# Patient Record
Sex: Female | Born: 1955 | Race: White | Hispanic: No | Marital: Married | Smoking: Never smoker
Health system: Southern US, Community
[De-identification: ages and names within clinical notes are randomized; demographics above are authoritative.]

## PROBLEM LIST (undated history)

## (undated) DIAGNOSIS — K760 Fatty (change of) liver, not elsewhere classified: Secondary | ICD-10-CM

## (undated) DIAGNOSIS — E039 Hypothyroidism, unspecified: Secondary | ICD-10-CM

## (undated) DIAGNOSIS — K219 Gastro-esophageal reflux disease without esophagitis: Secondary | ICD-10-CM

## (undated) DIAGNOSIS — K2100 Gastro-esophageal reflux disease with esophagitis, without bleeding: Secondary | ICD-10-CM

## (undated) DIAGNOSIS — I10 Essential (primary) hypertension: Secondary | ICD-10-CM

## (undated) DIAGNOSIS — M546 Pain in thoracic spine: Secondary | ICD-10-CM

## (undated) DIAGNOSIS — K824 Cholesterolosis of gallbladder: Secondary | ICD-10-CM

## (undated) DIAGNOSIS — M8580 Other specified disorders of bone density and structure, unspecified site: Secondary | ICD-10-CM

## (undated) DIAGNOSIS — K635 Polyp of colon: Secondary | ICD-10-CM

## (undated) HISTORY — DX: Other specified disorders of bone density and structure, unspecified site: M85.80

## (undated) HISTORY — DX: Hypothyroidism, unspecified: E03.9

## (undated) HISTORY — DX: Gastro-esophageal reflux disease with esophagitis, without bleeding: K21.00

## (undated) HISTORY — DX: Essential (primary) hypertension: I10

## (undated) HISTORY — DX: Gastro-esophageal reflux disease with esophagitis: K21.0

## (undated) HISTORY — PX: COLONOSCOPY: SHX174

## (undated) HISTORY — DX: Cholesterolosis of gallbladder: K82.4

## (undated) HISTORY — DX: Fatty (change of) liver, not elsewhere classified: K76.0

## (undated) HISTORY — PX: PARTIAL HYSTERECTOMY: SHX80

## (undated) HISTORY — DX: Polyp of colon: K63.5

## (undated) HISTORY — DX: Pain in thoracic spine: M54.6

## (undated) HISTORY — DX: Gastro-esophageal reflux disease without esophagitis: K21.9

---

## 1998-07-09 ENCOUNTER — Ambulatory Visit (HOSPITAL_COMMUNITY): Admission: RE | Admit: 1998-07-09 | Discharge: 1998-07-09 | Payer: Self-pay | Admitting: Obstetrics and Gynecology

## 1999-01-14 ENCOUNTER — Encounter (INDEPENDENT_AMBULATORY_CARE_PROVIDER_SITE_OTHER): Payer: Self-pay | Admitting: Specialist

## 1999-01-14 ENCOUNTER — Inpatient Hospital Stay (HOSPITAL_COMMUNITY): Admission: RE | Admit: 1999-01-14 | Discharge: 1999-01-15 | Payer: Self-pay | Admitting: Obstetrics and Gynecology

## 1999-08-04 ENCOUNTER — Encounter: Payer: Self-pay | Admitting: Obstetrics and Gynecology

## 1999-08-04 ENCOUNTER — Encounter: Admission: RE | Admit: 1999-08-04 | Discharge: 1999-08-04 | Payer: Self-pay | Admitting: Obstetrics and Gynecology

## 2000-02-19 ENCOUNTER — Other Ambulatory Visit: Admission: RE | Admit: 2000-02-19 | Discharge: 2000-02-19 | Payer: Self-pay | Admitting: Obstetrics and Gynecology

## 2000-04-02 ENCOUNTER — Ambulatory Visit (HOSPITAL_COMMUNITY): Admission: RE | Admit: 2000-04-02 | Discharge: 2000-04-02 | Payer: Self-pay | Admitting: Family Medicine

## 2000-04-02 ENCOUNTER — Encounter: Payer: Self-pay | Admitting: Family Medicine

## 2000-05-04 ENCOUNTER — Ambulatory Visit (HOSPITAL_COMMUNITY): Admission: RE | Admit: 2000-05-04 | Discharge: 2000-05-04 | Payer: Self-pay | Admitting: Internal Medicine

## 2000-05-04 ENCOUNTER — Encounter (INDEPENDENT_AMBULATORY_CARE_PROVIDER_SITE_OTHER): Payer: Self-pay

## 2000-05-04 HISTORY — PX: ESOPHAGOGASTRODUODENOSCOPY: SHX1529

## 2000-10-05 ENCOUNTER — Encounter: Admission: RE | Admit: 2000-10-05 | Discharge: 2000-10-05 | Payer: Self-pay | Admitting: Obstetrics and Gynecology

## 2000-10-05 ENCOUNTER — Encounter: Payer: Self-pay | Admitting: Obstetrics and Gynecology

## 2001-06-09 ENCOUNTER — Ambulatory Visit (HOSPITAL_COMMUNITY): Admission: RE | Admit: 2001-06-09 | Discharge: 2001-06-09 | Payer: Self-pay | Admitting: Internal Medicine

## 2001-06-09 ENCOUNTER — Encounter (INDEPENDENT_AMBULATORY_CARE_PROVIDER_SITE_OTHER): Payer: Self-pay | Admitting: Specialist

## 2001-06-09 DIAGNOSIS — K635 Polyp of colon: Secondary | ICD-10-CM

## 2001-06-09 HISTORY — DX: Polyp of colon: K63.5

## 2002-01-28 ENCOUNTER — Ambulatory Visit (HOSPITAL_COMMUNITY): Admission: RE | Admit: 2002-01-28 | Discharge: 2002-01-28 | Payer: Self-pay | Admitting: *Deleted

## 2002-01-28 ENCOUNTER — Encounter: Payer: Self-pay | Admitting: *Deleted

## 2002-02-08 ENCOUNTER — Encounter: Admission: RE | Admit: 2002-02-08 | Discharge: 2002-02-08 | Payer: Self-pay | Admitting: *Deleted

## 2002-02-08 ENCOUNTER — Encounter: Payer: Self-pay | Admitting: *Deleted

## 2002-09-07 ENCOUNTER — Other Ambulatory Visit: Admission: RE | Admit: 2002-09-07 | Discharge: 2002-09-07 | Payer: Self-pay | Admitting: Obstetrics and Gynecology

## 2003-09-18 ENCOUNTER — Encounter: Admission: RE | Admit: 2003-09-18 | Discharge: 2003-09-18 | Payer: Self-pay | Admitting: Obstetrics and Gynecology

## 2003-10-17 ENCOUNTER — Other Ambulatory Visit: Admission: RE | Admit: 2003-10-17 | Discharge: 2003-10-17 | Payer: Self-pay | Admitting: Obstetrics and Gynecology

## 2004-12-18 ENCOUNTER — Other Ambulatory Visit: Admission: RE | Admit: 2004-12-18 | Discharge: 2004-12-18 | Payer: Self-pay | Admitting: Obstetrics and Gynecology

## 2005-08-18 ENCOUNTER — Encounter: Admission: RE | Admit: 2005-08-18 | Discharge: 2005-08-18 | Payer: Self-pay | Admitting: Obstetrics and Gynecology

## 2007-08-01 ENCOUNTER — Encounter: Admission: RE | Admit: 2007-08-01 | Discharge: 2007-08-01 | Payer: Self-pay | Admitting: Obstetrics and Gynecology

## 2010-04-24 ENCOUNTER — Emergency Department (HOSPITAL_BASED_OUTPATIENT_CLINIC_OR_DEPARTMENT_OTHER)
Admission: EM | Admit: 2010-04-24 | Discharge: 2010-04-24 | Payer: Self-pay | Source: Home / Self Care | Admitting: Emergency Medicine

## 2010-04-24 IMAGING — US US ABDOMEN COMPLETE
1 series · 14 of 25 positions shown · non-contrast
Comparison: None.

CLINICAL DATA: Abdominal pain.  Right flank pain

COMPLETE ABDOMINAL ULTRASOUND

[Series 1: us abdomen complete · 0.30mm/px · 14 of 75 slices shown]
[im 1/75]
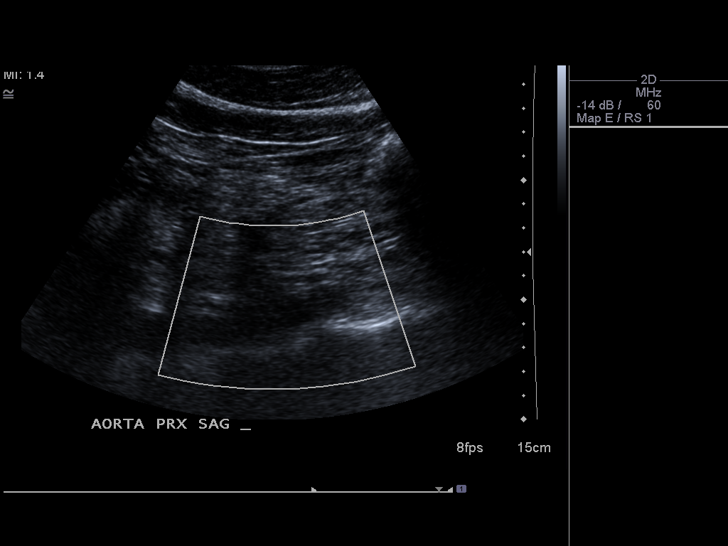
[im 7/75]
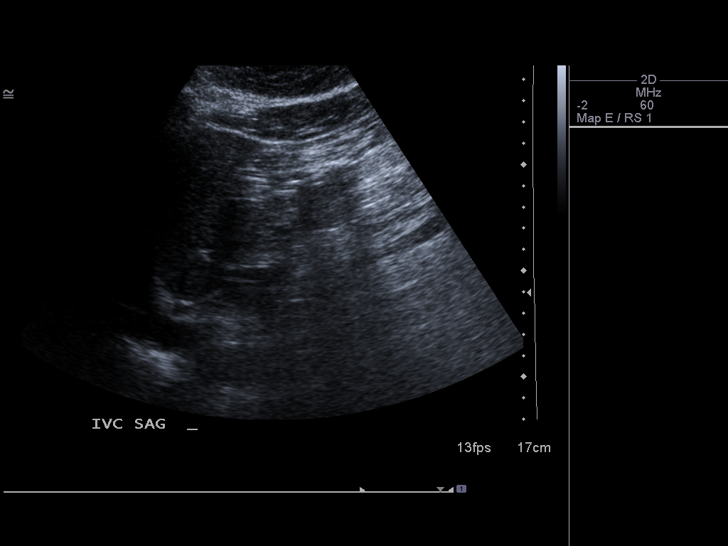
[im 13/75]
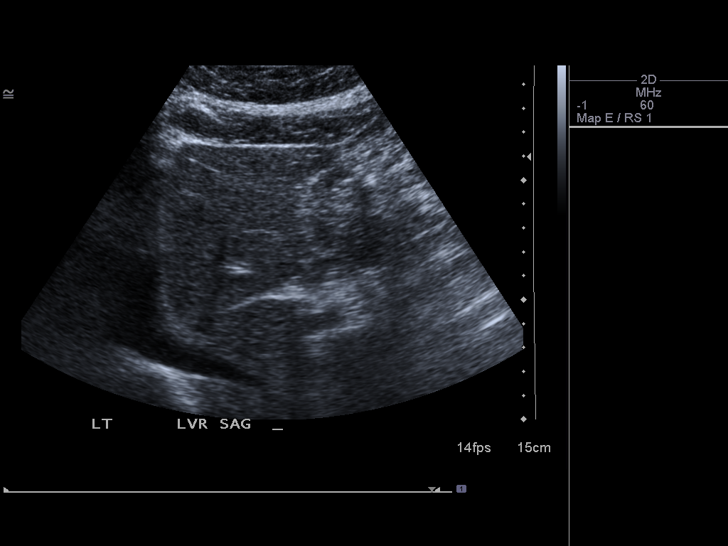
[im 19/75]
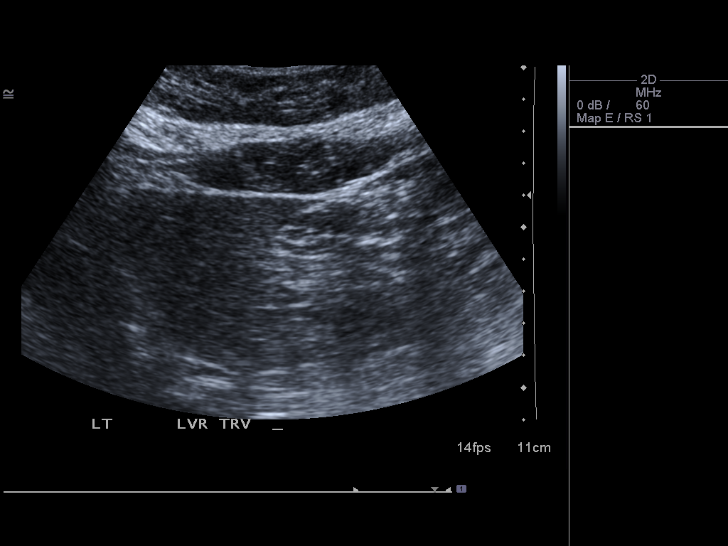
[im 25/75]
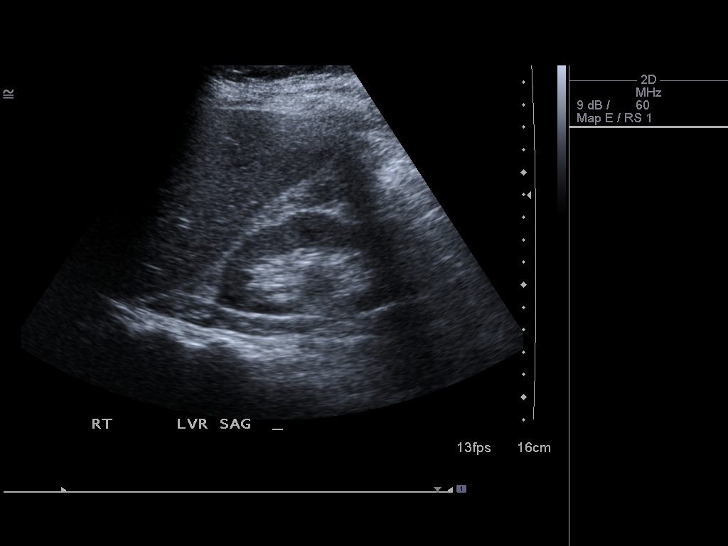
[im 28/75]
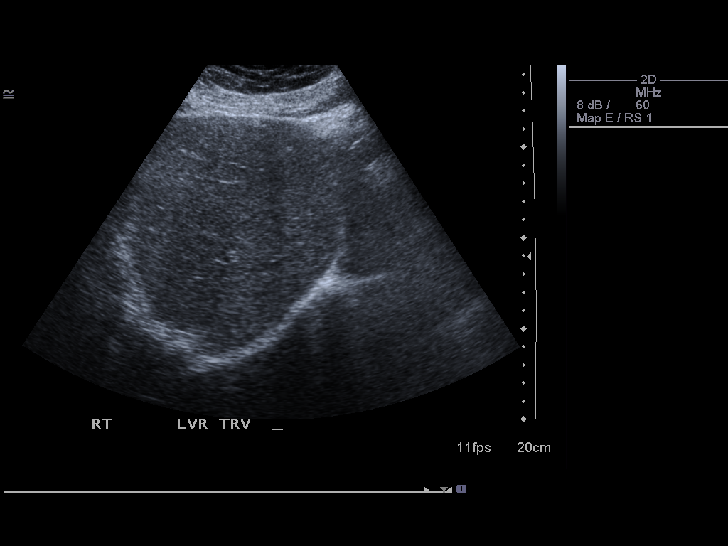
[im 34/75]
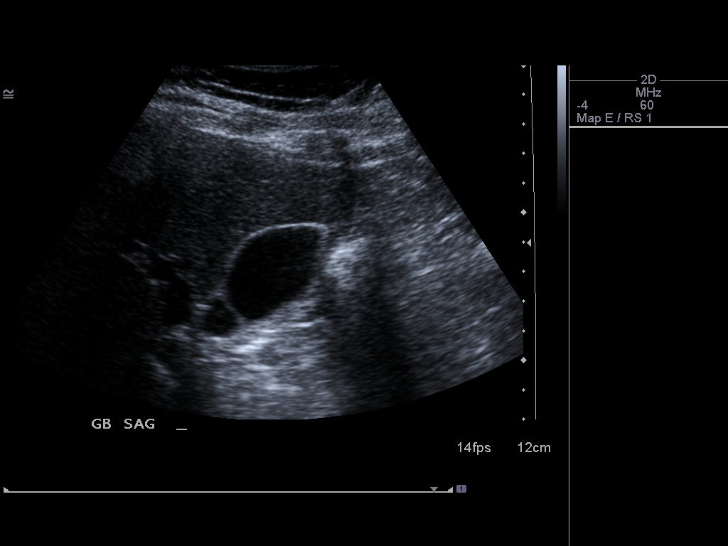
[im 41/75]
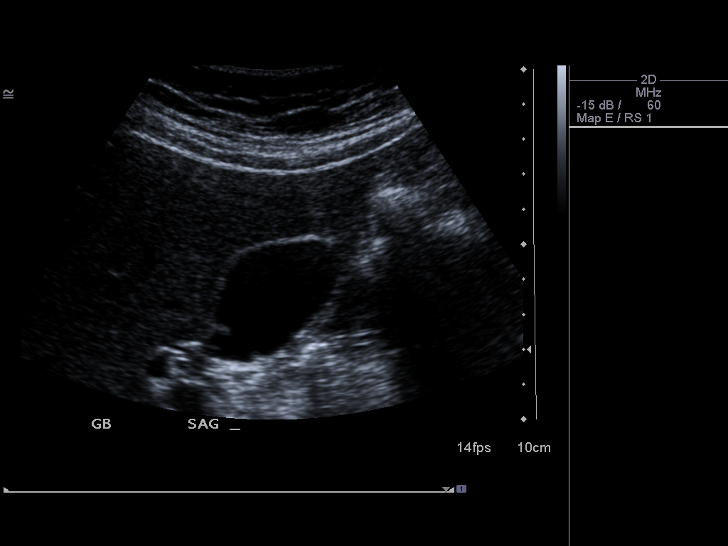
[im 47/75]
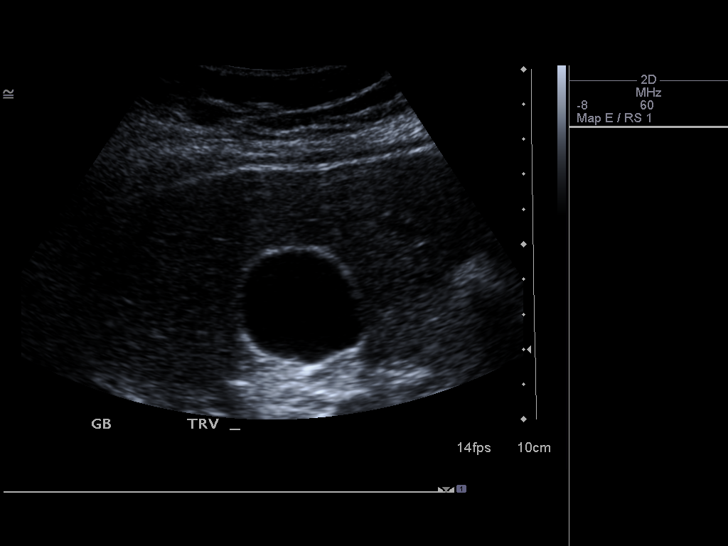
[im 50/75]
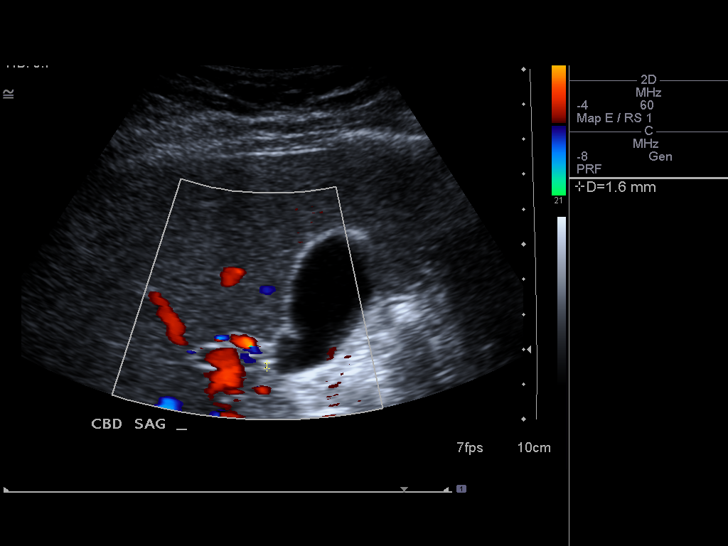
[im 56/75]
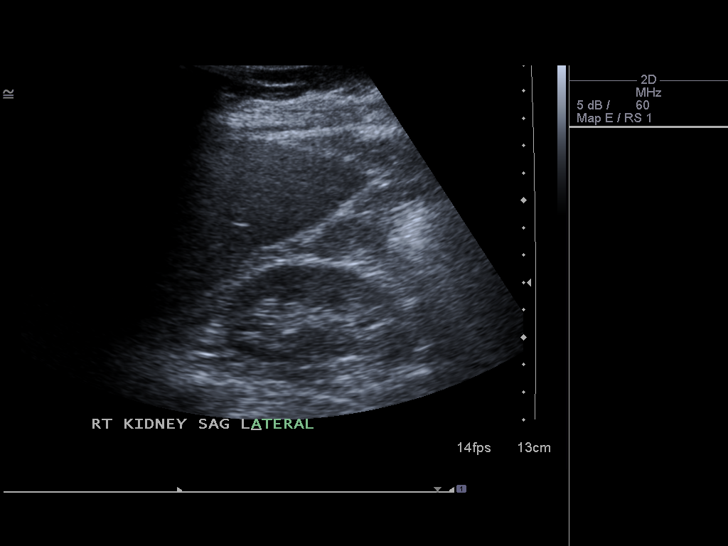
[im 62/75]
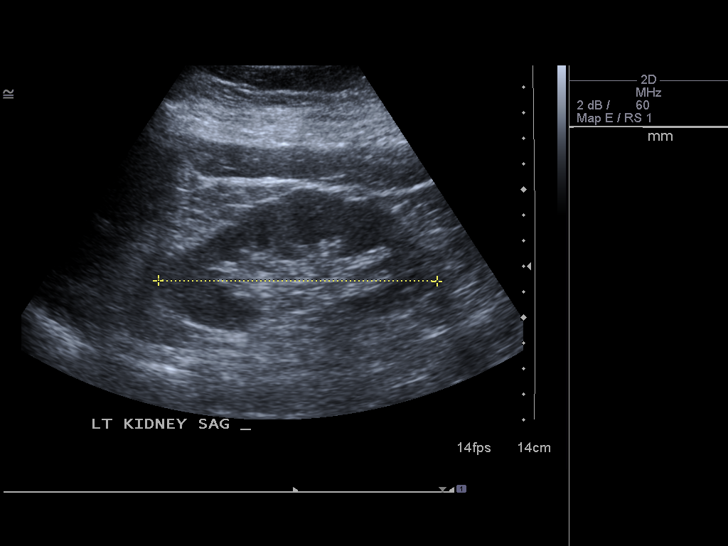
[im 68/75]
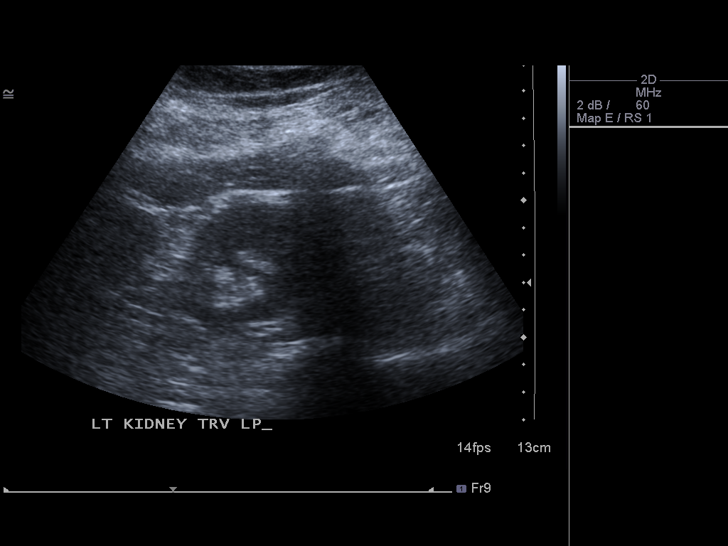
[im 75/75]
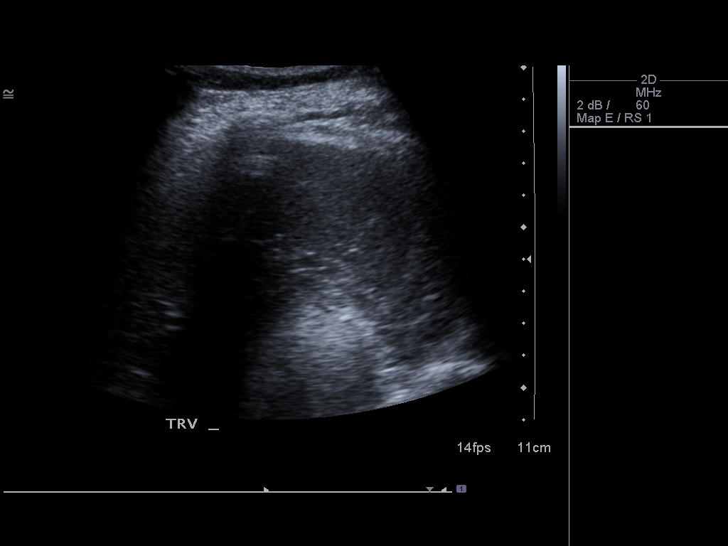

[14 of 25 positions shown; findings below may reference images not displayed]

FINDINGS: Gallbladder:  No gallstones, gallbladder wall thickening, or
pericholecystic fluid.

Common bile duct:  1.6 mm

Liver:  Normal echogenicity.  Negative for mass lesion in the
liver.

IVC:   limited

Pancreas:  Limited

Spleen:  8.6 cm

Right Kidney:  10.1 cm.  Negative for obstruction or mass.

Left Kidney:  10.9 cm.  Negative for obstruction or mass.

Abdominal aorta:  No aneurysm identified.
IMPRESSION: Negative abdominal ultrasound.

## 2010-04-24 IMAGING — CR DG CHEST 2V
2 series · 2 of 2 positions shown · non-contrast
Comparison: None.

CLINICAL DATA: Right rib pain.  Back pain

CHEST - 2 VIEW

[w chest pa]
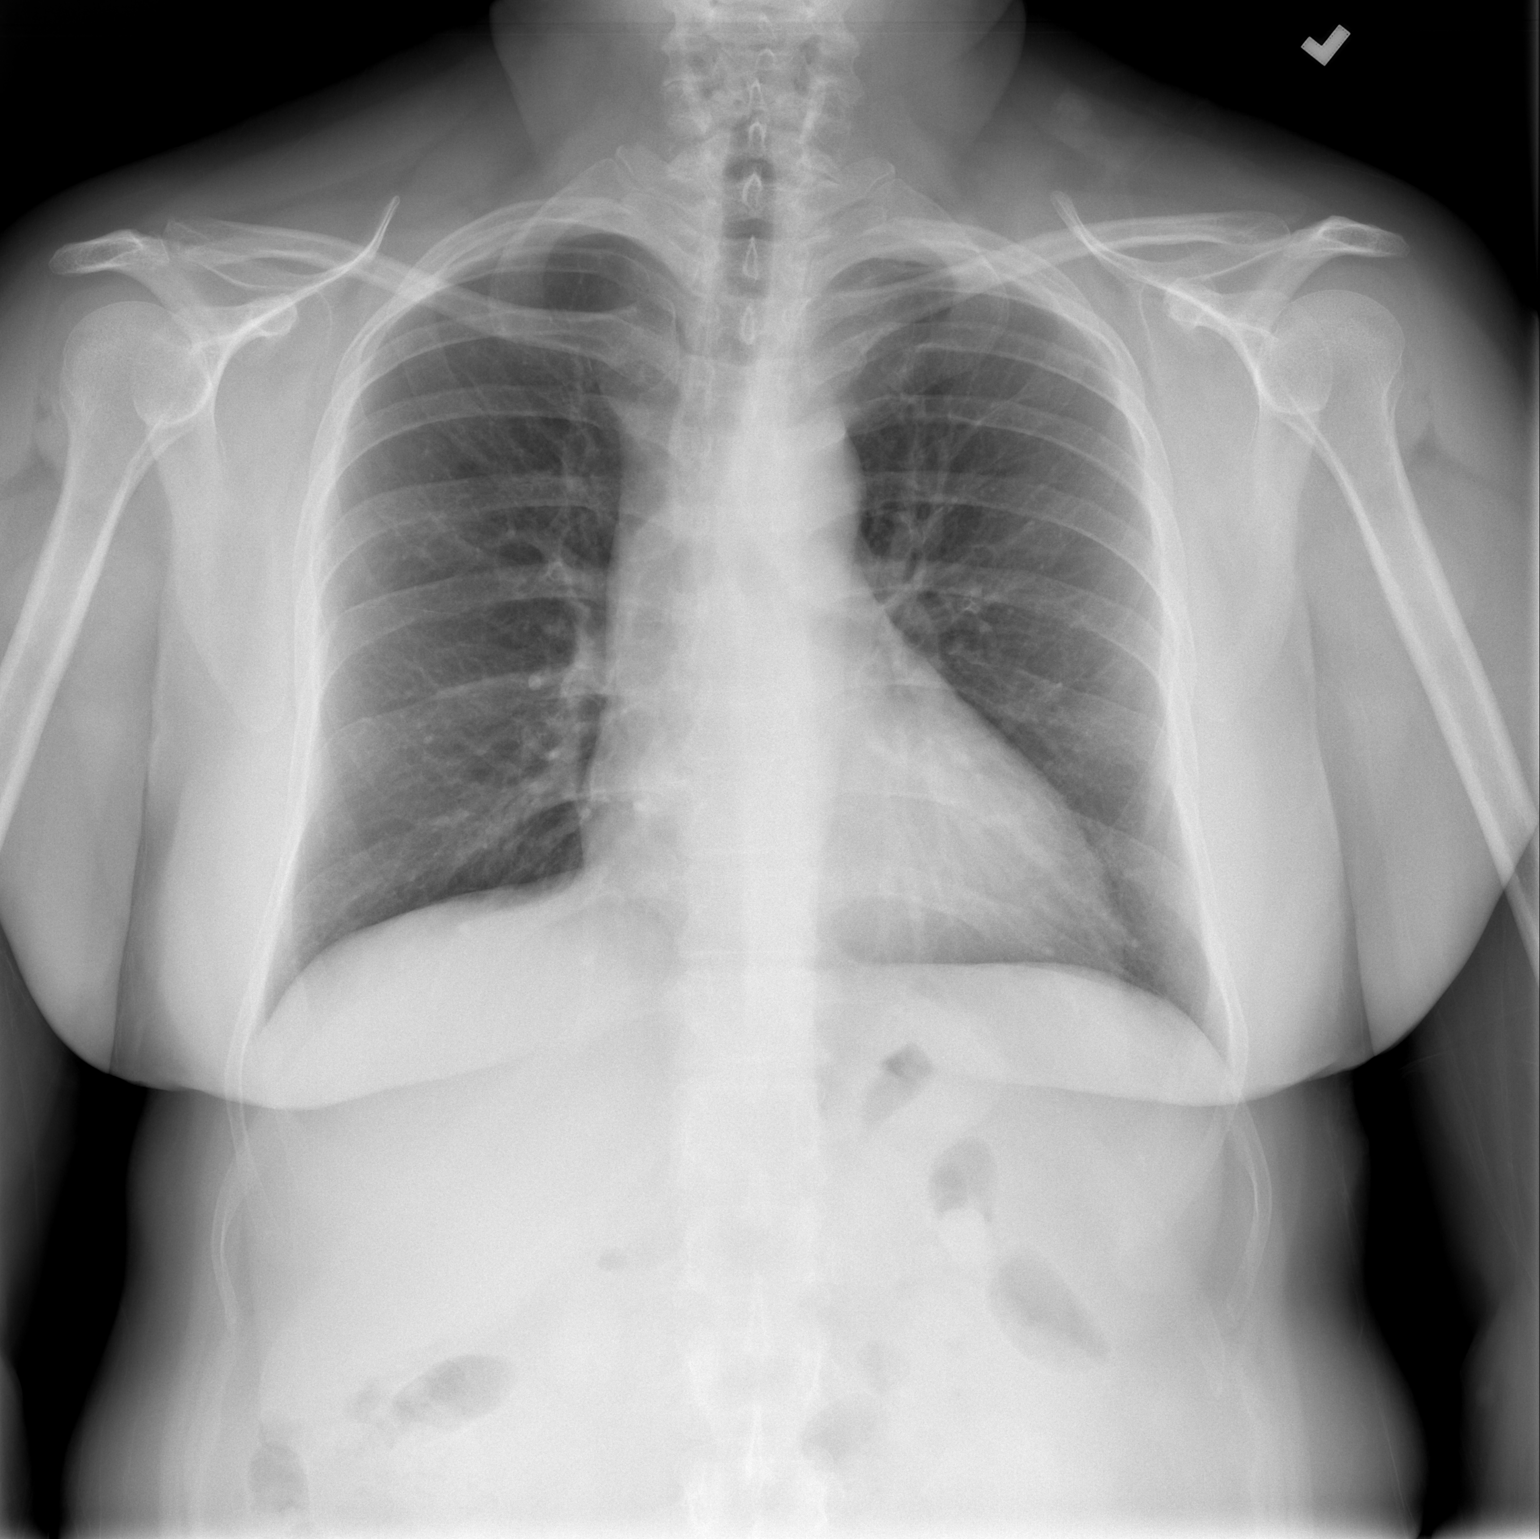

[w chest lat]
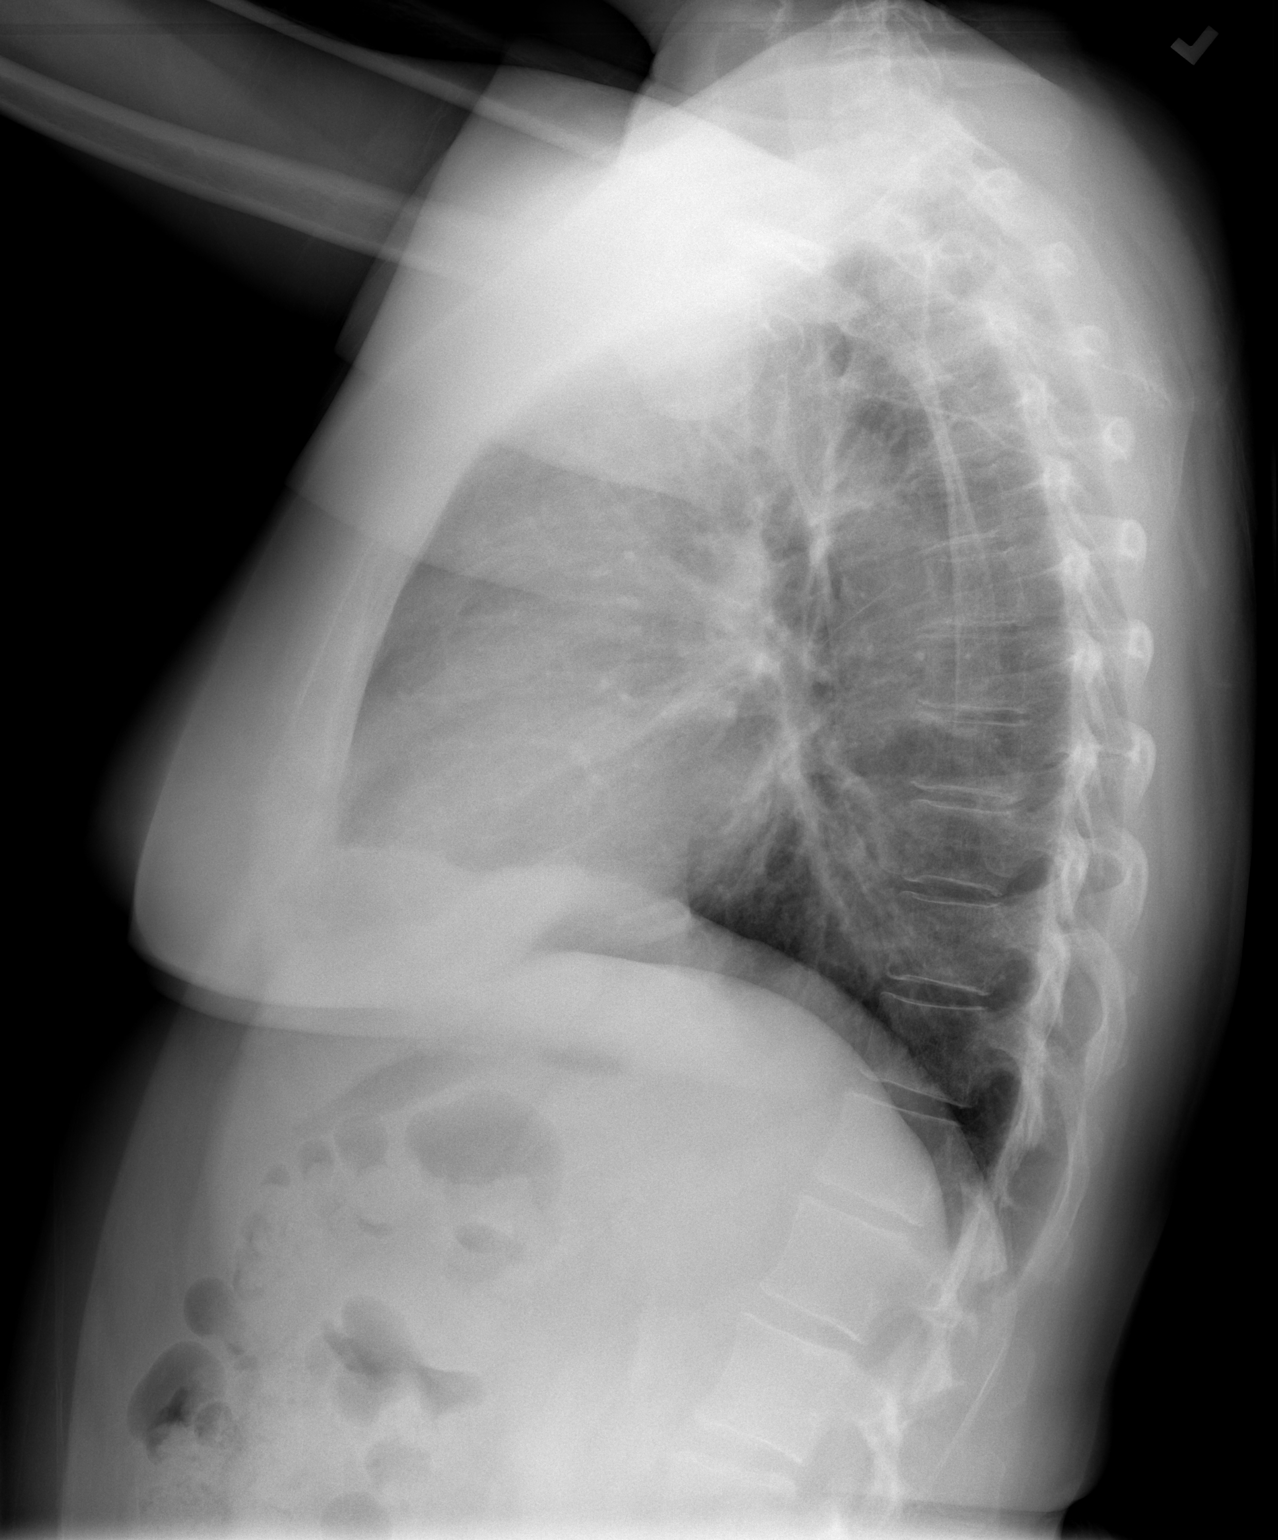

[2 of 2 positions shown; findings below may reference images not displayed]

FINDINGS: The heart size and mediastinal contours are within
normal limits.  Both lungs are clear.  The visualized skeletal
structures are unremarkable.
IMPRESSION: No active cardiopulmonary disease.

## 2010-07-07 LAB — URINALYSIS, ROUTINE W REFLEX MICROSCOPIC
Bilirubin Urine: NEGATIVE
Glucose, UA: NEGATIVE mg/dL
Ketones, ur: NEGATIVE mg/dL
Nitrite: NEGATIVE
Specific Gravity, Urine: 1.003 — ABNORMAL LOW (ref 1.005–1.030)
pH: 6.5 (ref 5.0–8.0)

## 2010-07-07 LAB — DIFFERENTIAL
Basophils Absolute: 0 10*3/uL (ref 0.0–0.1)
Basophils Relative: 1 % (ref 0–1)
Lymphocytes Relative: 22 % (ref 12–46)
Lymphs Abs: 1.6 10*3/uL (ref 0.7–4.0)
Monocytes Relative: 11 % (ref 3–12)
Neutro Abs: 5 10*3/uL (ref 1.7–7.7)

## 2010-07-07 LAB — COMPREHENSIVE METABOLIC PANEL
AST: 18 U/L (ref 0–37)
Albumin: 4.3 g/dL (ref 3.5–5.2)
CO2: 29 mEq/L (ref 19–32)
Potassium: 4.6 mEq/L (ref 3.5–5.1)
Sodium: 145 mEq/L (ref 135–145)

## 2010-07-07 LAB — CBC
Hemoglobin: 13.5 g/dL (ref 12.0–15.0)
Platelets: 286 10*3/uL (ref 150–400)

## 2010-09-12 NOTE — Procedures (Signed)
[place]. [facility]  Patient:    Anne Simpson, Anne Simpson Visit Number: [id number] MRN: [id number]          Service Type: END Location: ENDO Attending Physician:  Mervin Hack Dictated by:   Hedwig Morton. Juanda Chance, M.D. [facility] Admit Date:  06/09/2001   CC:         Amada Jupiter T. Dreiling, M.D.   Procedure Report  PROCEDURE:  Colonoscopy.  INDICATION:  This 55 year old white female has a positive family history of colon cancer in her brother, who died at age of 81.  Her mother has a history of colon polyps.  The patient has had several colonoscopies elsewhere in 1993, 1995, and 1996.  She is now undergoing repeat colonoscopy.  ENDOSCOPE:  Olympus single-channel video scope.  SEDATION:  Versed 8.5 mg IV, Demerol 100 mg IV.  FINDINGS:  The Olympus single-channel video endoscope passed under direct vision through the rectum to the sigmoid colon.  The patient was monitored by pulse oximeter, and oxygen saturations were normal.  The prep was excellent. Anal canal and rectal ampulla were normal.  At the level of 15 cm from the rectum were two polyps.  One measured about 9 mm in diameter and was sessile. One was tiny, diminutive polyp next to it.  There were no diverticula in the sigmoid colon.  Splenic flexure, transverse colon, hepatic flexure were normal.  Ascending colon and cecum were normal.  As the colonoscope was retracted from the cecum to the left colon, the two polyps were removed with snare and sent to pathology.  The patient tolerated the procedure well.  IMPRESSION:  Two colon polyps, status post polypectomy.  PLAN: 1. Postpolypectomy instructions. 2. Repeat colonoscopy in three years. Dictated by:   Hedwig Morton. Juanda Chance, M.D. [facility] Attending Physician:  Mervin Hack DD:  06/09/01 TD:  06/09/01 Job: [id number]

## 2013-09-11 ENCOUNTER — Encounter: Payer: Self-pay | Admitting: Internal Medicine

## 2013-11-02 ENCOUNTER — Other Ambulatory Visit: Payer: Self-pay | Admitting: Internal Medicine

## 2013-11-02 ENCOUNTER — Ambulatory Visit
Admission: RE | Admit: 2013-11-02 | Discharge: 2013-11-02 | Disposition: A | Discharge status: Home / Self Care | Payer: BC Managed Care – PPO | Source: Ambulatory Visit | Attending: Internal Medicine | Admitting: Internal Medicine

## 2013-11-02 DIAGNOSIS — M546 Pain in thoracic spine: Secondary | ICD-10-CM

## 2013-11-02 DIAGNOSIS — R110 Nausea: Secondary | ICD-10-CM

## 2013-11-02 DIAGNOSIS — K760 Fatty (change of) liver, not elsewhere classified: Secondary | ICD-10-CM

## 2013-11-02 DIAGNOSIS — R1031 Right lower quadrant pain: Secondary | ICD-10-CM

## 2013-11-02 DIAGNOSIS — K824 Cholesterolosis of gallbladder: Secondary | ICD-10-CM

## 2013-11-02 HISTORY — DX: Fatty (change of) liver, not elsewhere classified: K76.0

## 2013-11-02 HISTORY — DX: Cholesterolosis of gallbladder: K82.4

## 2013-11-02 IMAGING — US US ABDOMEN COMPLETE
1 series · 14 of 25 positions shown · non-contrast
Comparison: None.

CLINICAL DATA: Pain.

EXAM:
ULTRASOUND ABDOMEN COMPLETE

[Series 1: us abdomen complete · 0.30mm/px · 14 of 75 slices shown]
[im 1/75]
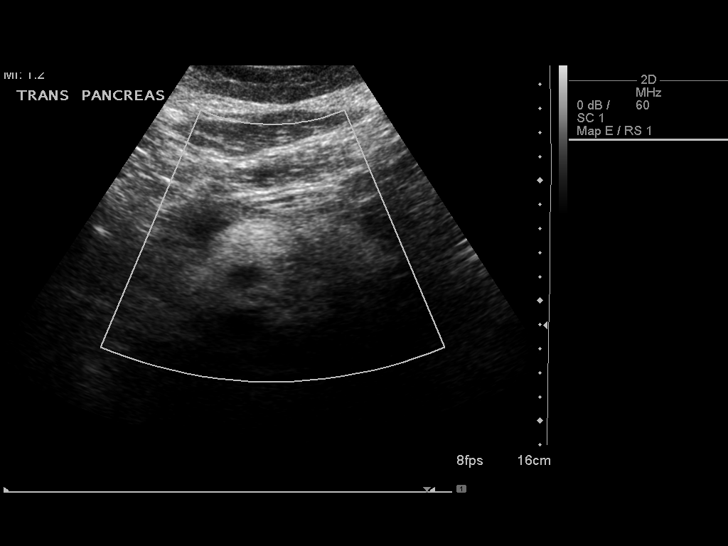
[im 7/75]
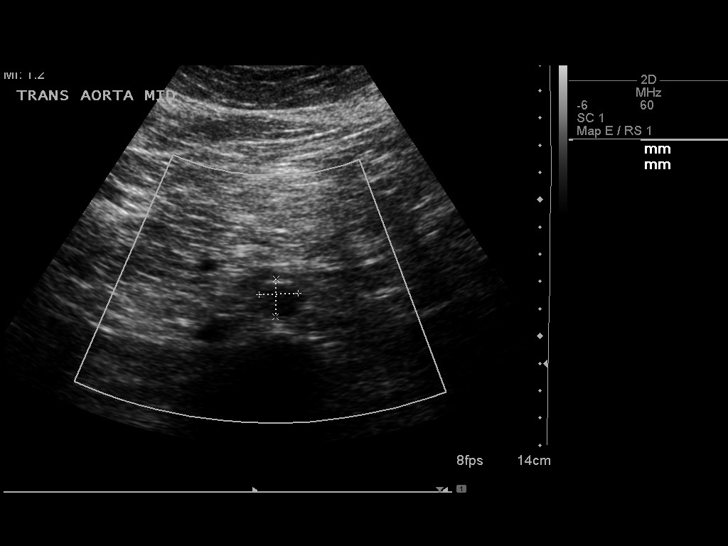
[im 13/75]
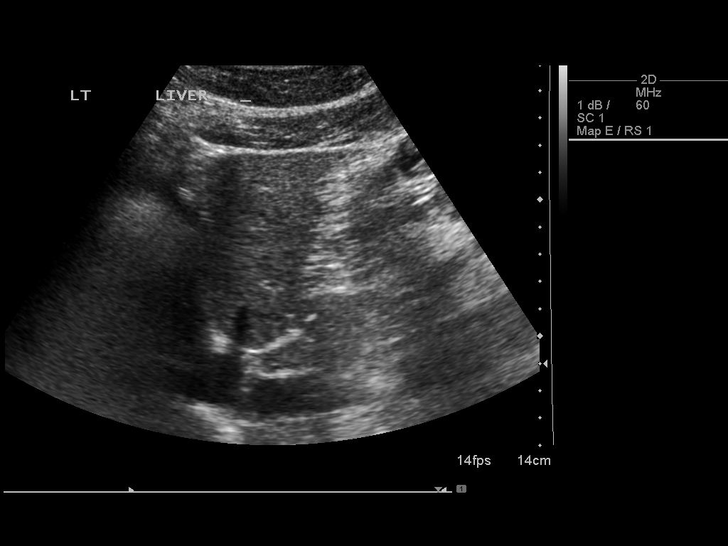
[im 19/75]
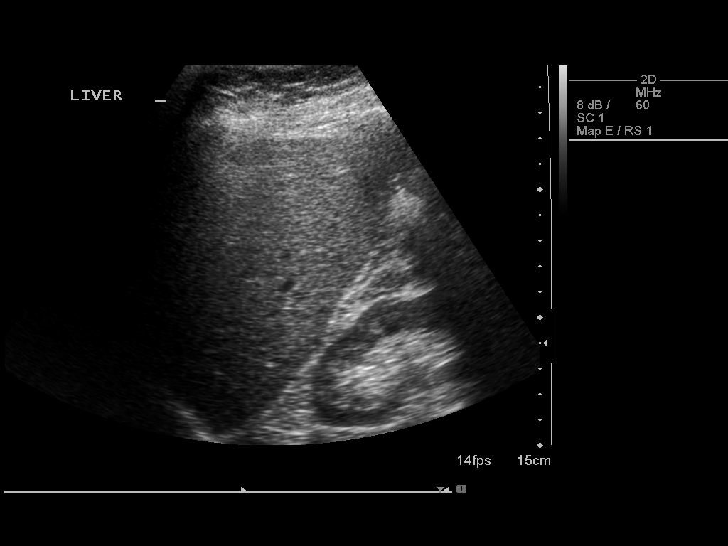
[im 25/75]
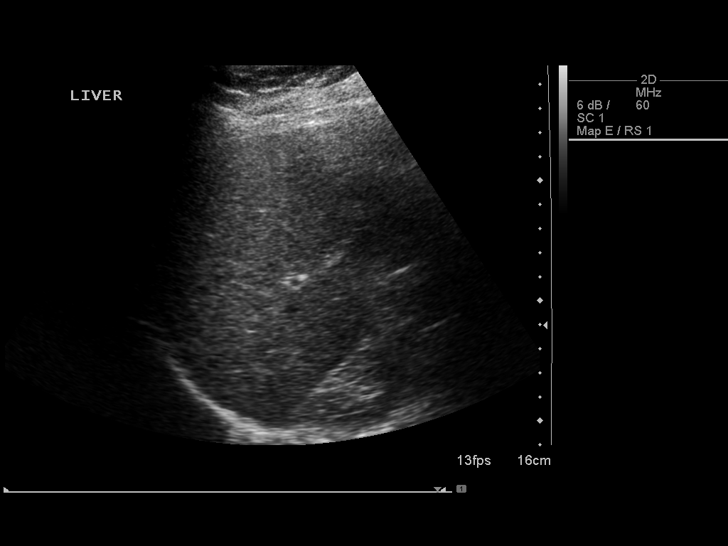
[im 28/75]
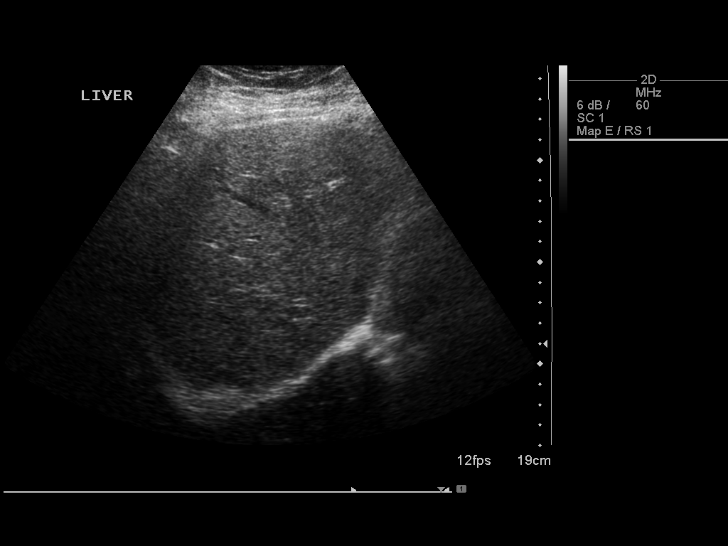
[im 34/75]
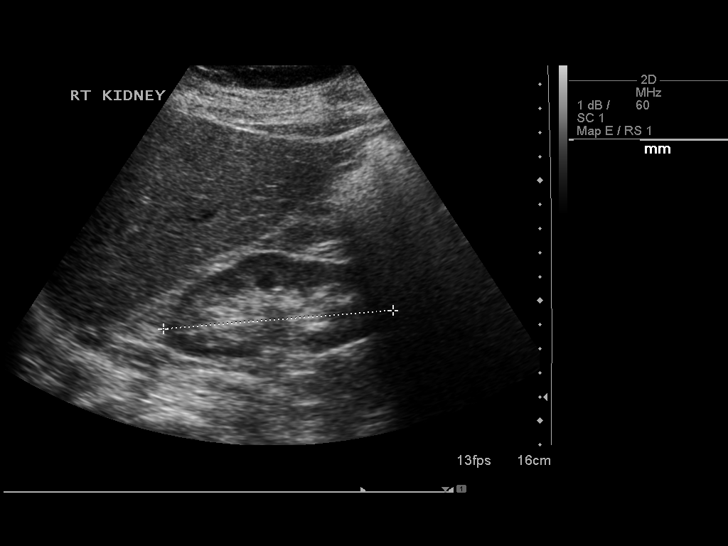
[im 41/75]
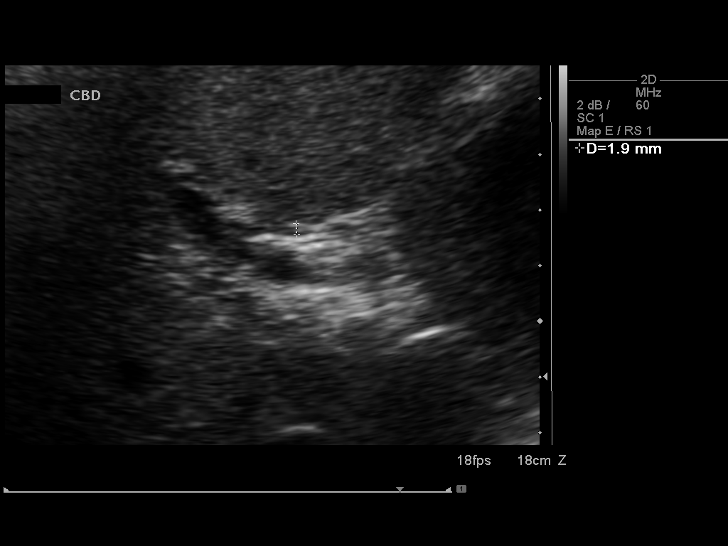
[im 47/75]
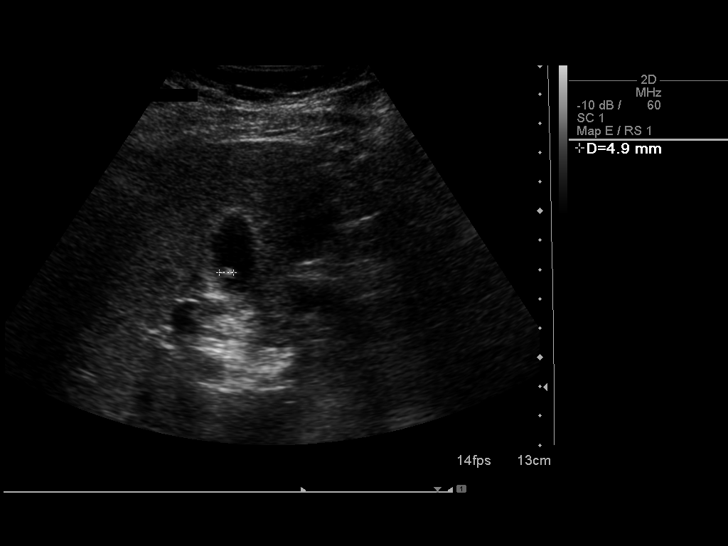
[im 50/75]
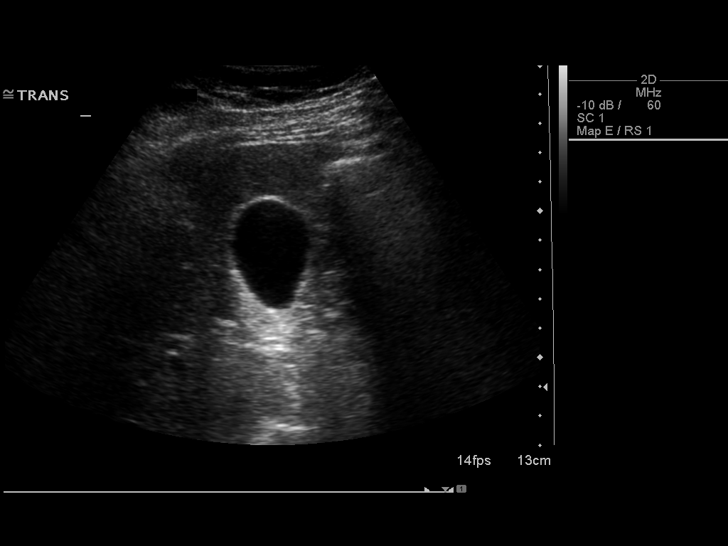
[im 56/75]
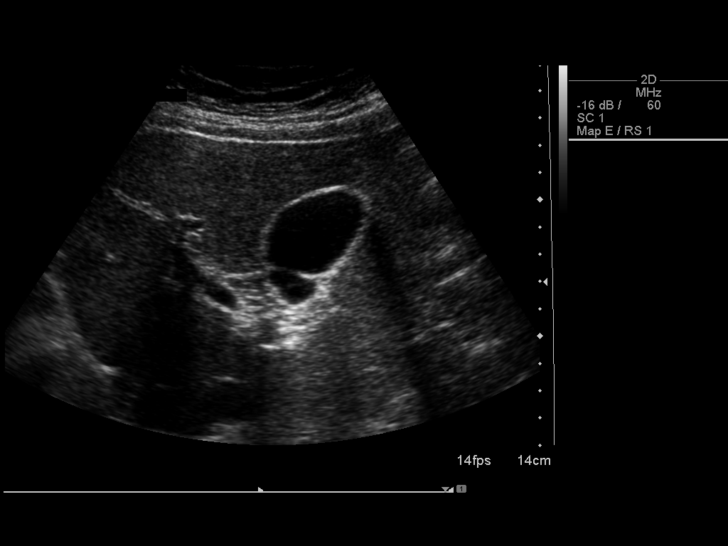
[im 62/75]
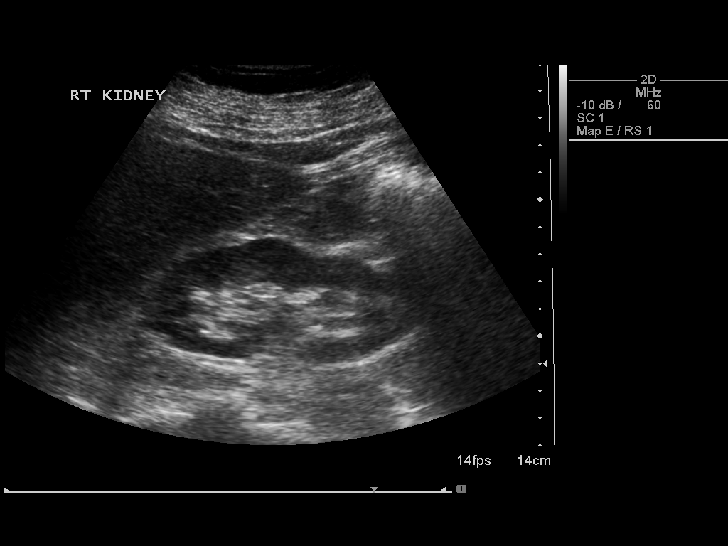
[im 68/75]
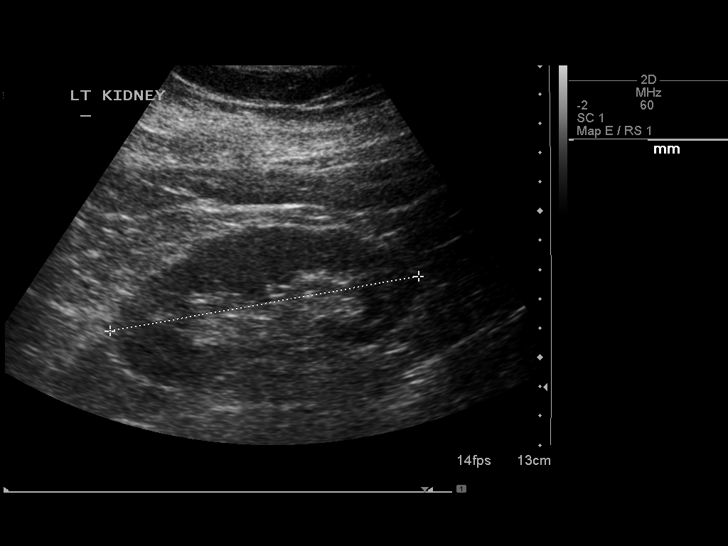
[im 75/75]
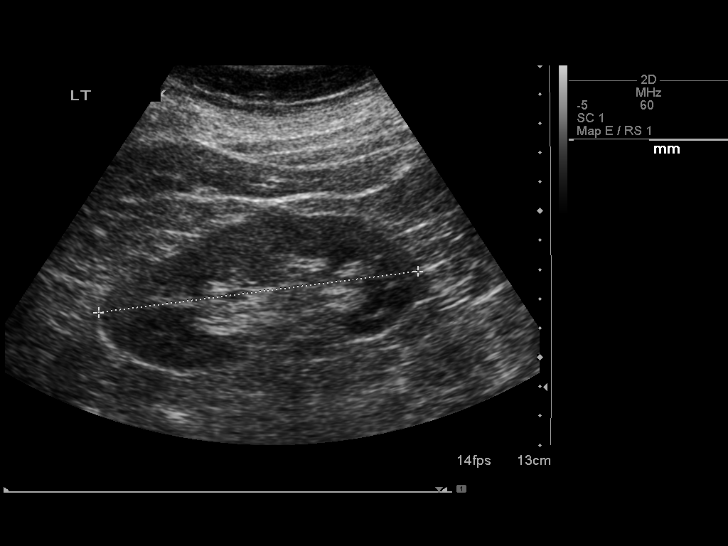

[14 of 25 positions shown; findings below may reference images not displayed]

FINDINGS: Gallbladder:

4 mm non shadowing active density gallbladder wall, most likely
polyp. Gallbladder otherwise unremarkable. Gallbladder wall
thickness 2 mm. Negative Murphy's sign. No pericholecystic fluid.

Common bile duct:

Diameter: 1.9 mm

Liver:

Liver is echogenic suggesting chronic fatty infiltration.

IVC:

No abnormality visualized.

Pancreas:

Visualized portion unremarkable.

Spleen:

Size and appearance within normal limits.

Right Kidney:

Length: 10.6 cm. Echogenicity within normal limits. No mass or
hydronephrosis visualized.

Left Kidney:

Length: 11.0 Cm. Echogenicity within normal limits. No mass or
hydronephrosis visualized.

Abdominal aorta:

No aneurysm visualized.

Other findings:

None.
IMPRESSION: 1. 4 mm polyp gallbladder.
2. Fatty infiltration of the liver.

## 2013-11-03 ENCOUNTER — Encounter: Payer: Self-pay | Admitting: Internal Medicine

## 2013-11-06 ENCOUNTER — Other Ambulatory Visit: Payer: Self-pay | Admitting: Internal Medicine

## 2013-11-06 ENCOUNTER — Encounter: Payer: Self-pay | Admitting: Gastroenterology

## 2013-11-06 DIAGNOSIS — R1011 Right upper quadrant pain: Secondary | ICD-10-CM

## 2013-11-06 DIAGNOSIS — R110 Nausea: Secondary | ICD-10-CM

## 2013-11-15 ENCOUNTER — Ambulatory Visit
Admission: RE | Admit: 2013-11-15 | Discharge: 2013-11-15 | Disposition: A | Discharge status: Home / Self Care | Payer: BC Managed Care – PPO | Source: Ambulatory Visit | Attending: Internal Medicine | Admitting: Internal Medicine

## 2013-11-15 DIAGNOSIS — R1011 Right upper quadrant pain: Secondary | ICD-10-CM

## 2013-11-15 DIAGNOSIS — R110 Nausea: Secondary | ICD-10-CM

## 2013-11-15 IMAGING — CT CT ABDOMEN W/ CM
3 of 5 series · 13 of 36 positions shown, 19 images · IV contrast (READICAT/WATER & [ID] OMNI 300)
Comparison: Ultrasound of the abdomen of [DATE]

CLINICAL DATA: Nausea, abdominal pain, right upper quadrant pain
radiating to the flank

EXAM:
CT ABDOMEN WITH CONTRAST
TECHNIQUE: Multidetector CT imaging of the abdomen was performed using the
standard protocol following bolus administration of intravenous
contrast.
CONTRAST:  100mL OMNIPAQUE IOHEXOL 300 MG/ML  SOLN

[Series 3: abdomen with · axial · 0.74mm/px · z∈[-140,+40]mm · 5 of 55 slices shown, 10 images]
[im 10/55  soft-tissue]
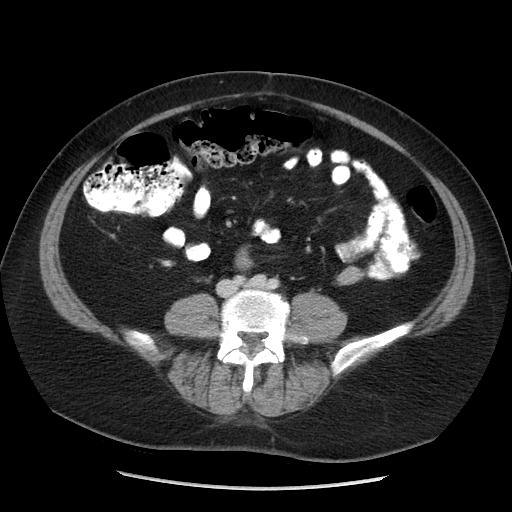
[im 10/55  bone]
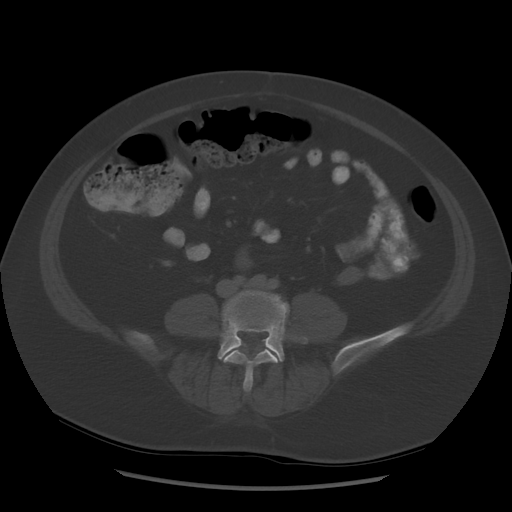
[im 19/55  soft-tissue]
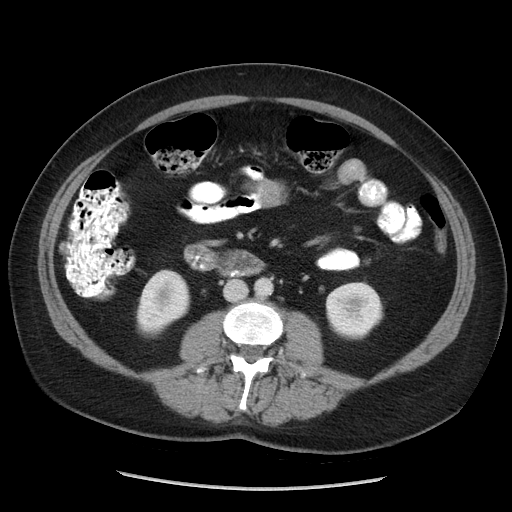
[im 19/55  lung]
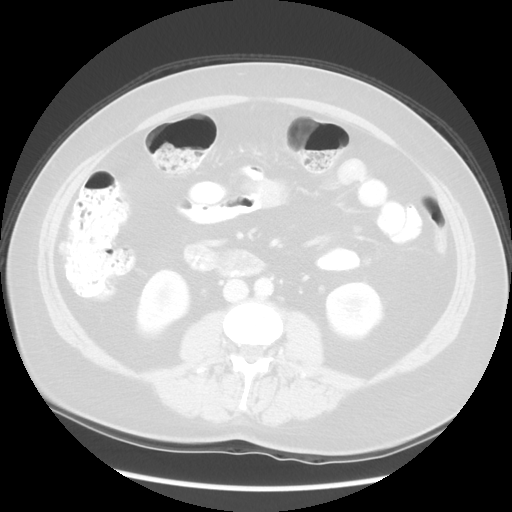
[im 28/55  soft-tissue]
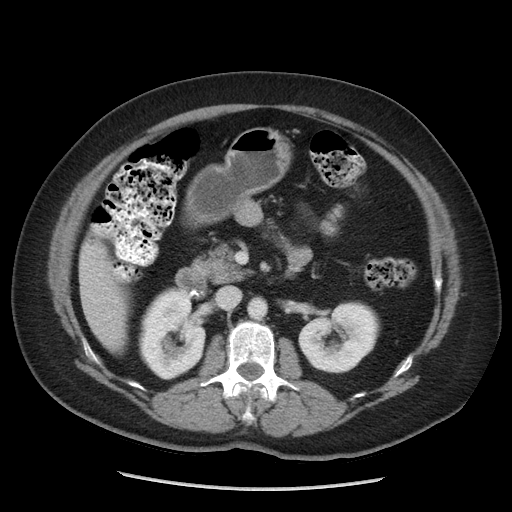
[im 28/55  lung]
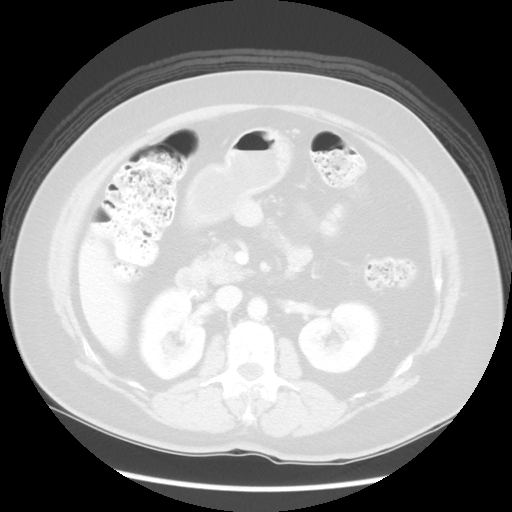
[im 37/55  soft-tissue]
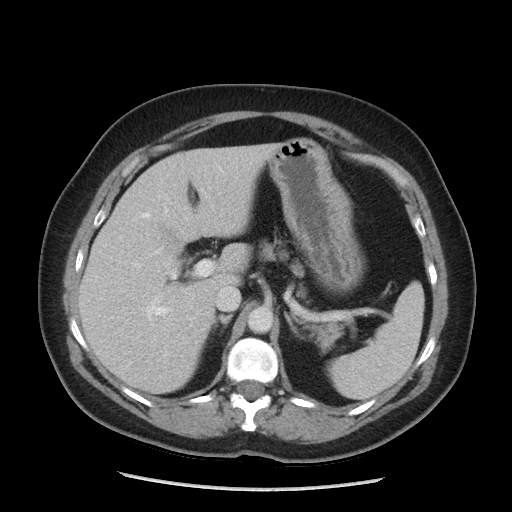
[im 37/55  lung]
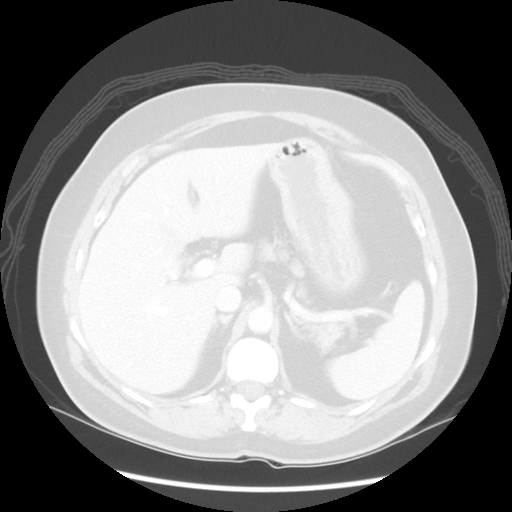
[im 46/55  soft-tissue]
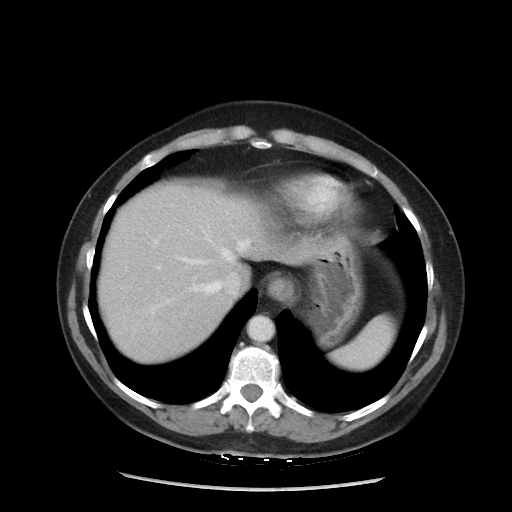
[im 46/55  lung]
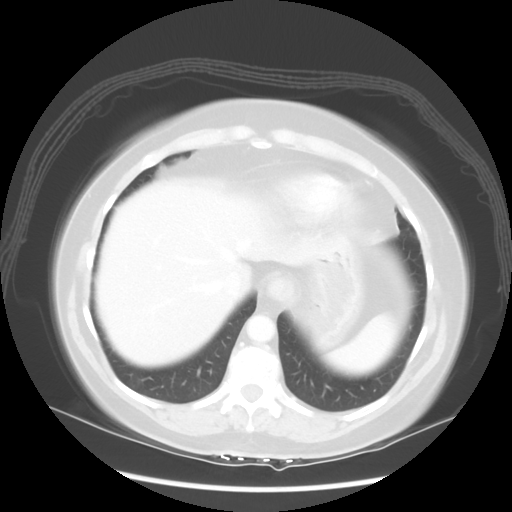

[Series 601: coronal body · coronal · 0.74mm/px · 1 of 124 slices shown, 2 images]
[im 42/124  soft-tissue]
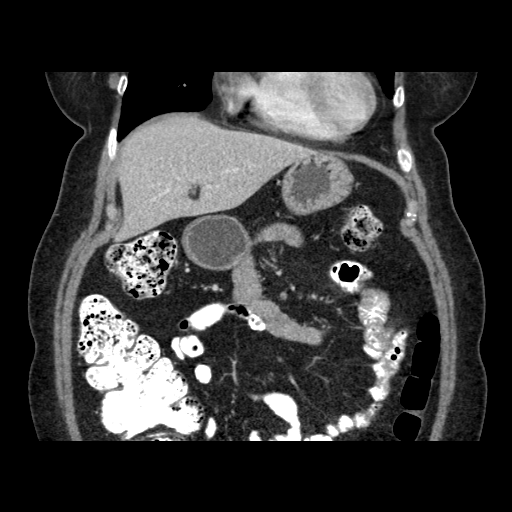
[im 42/124  bone]
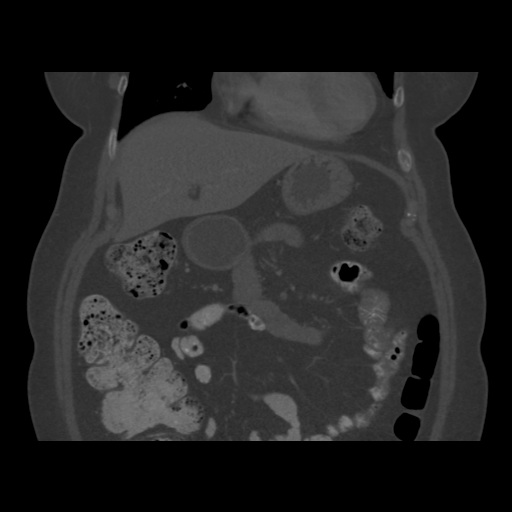

[Series 602: sagittal body · sagittal · 0.74mm/px · 7 of 153 slices shown]
[im 17/153  soft-tissue]
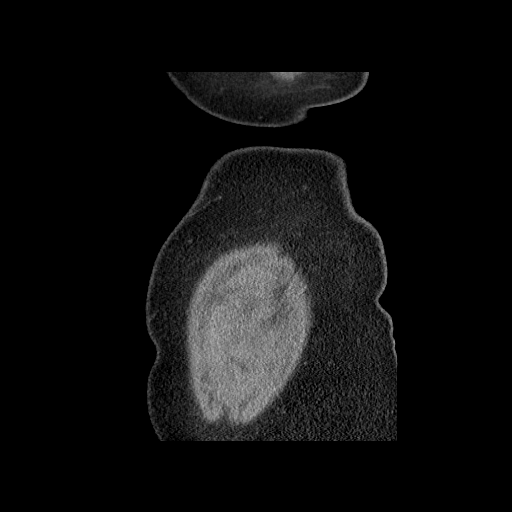
[im 34/153  soft-tissue]
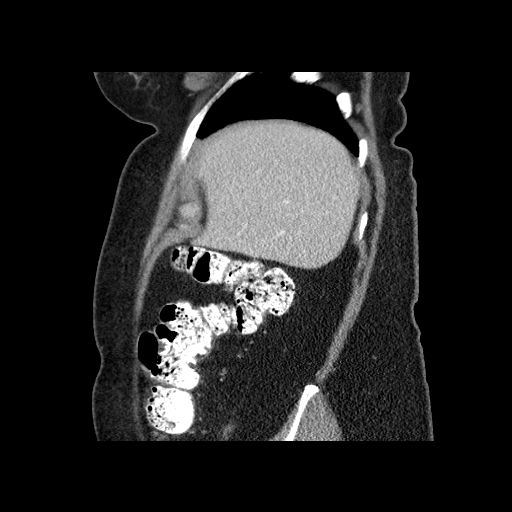
[im 51/153  soft-tissue]
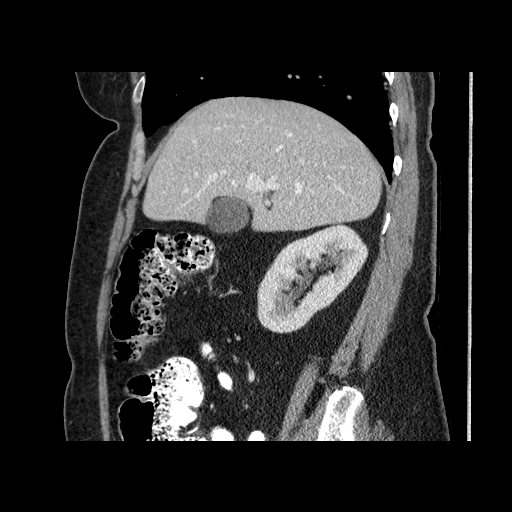
[im 68/153  soft-tissue]
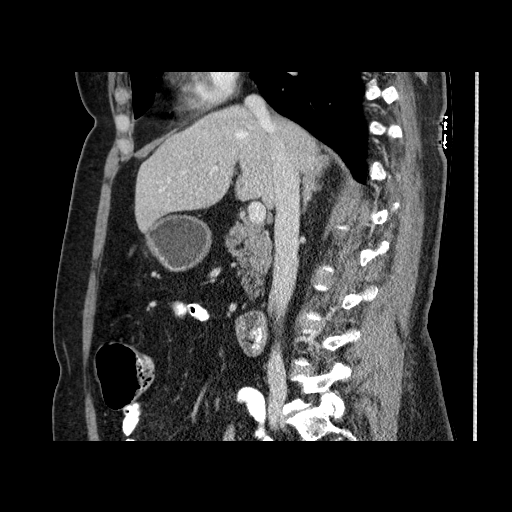
[im 85/153  soft-tissue]
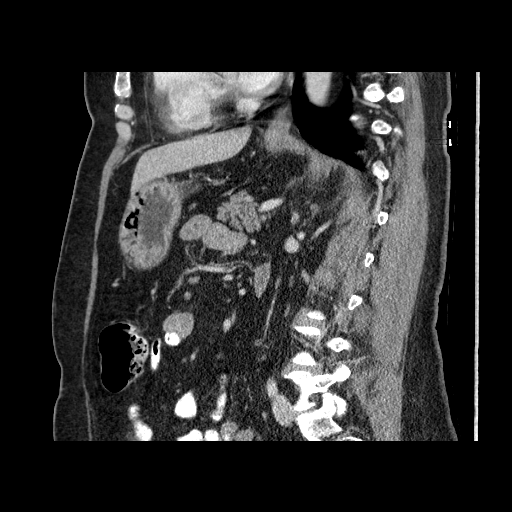
[im 102/153  soft-tissue]
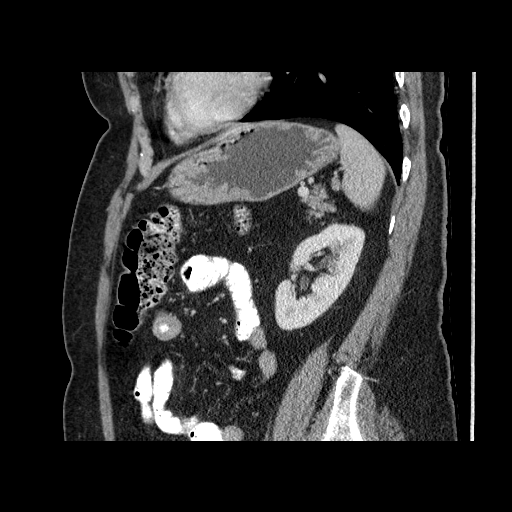
[im 119/153  soft-tissue]
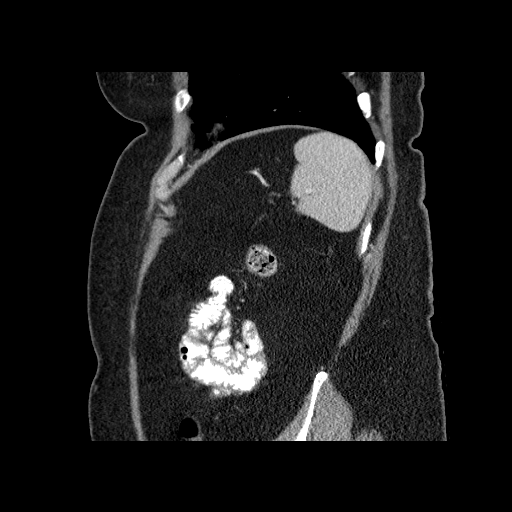

[13 of 36 positions shown; findings below may reference images not displayed]

FINDINGS: The lung bases are clear. The liver enhances with no focal
abnormality and no ductal dilatation is seen. No calcified
gallstones are noted in the gallbladder wall is not thickened. The
pancreas is normal in size and the pancreatic duct is not dilated.
The adrenal glands and spleen are unremarkable. The stomach is
moderately fluid distended with no abnormality noted. The kidneys
enhance and no renal calculi are seen. There is no evidence of
hydronephrosis. On delayed images, the pelvocaliceal systems are
unremarkable and the proximal ureters are normal in caliber. The
abdominal aorta is normal in caliber. No adenopathy is seen. There
are small mesenteric lymph nodes present, none of which are
pathologically enlarged. The appendix is only partially visualized
on the lowest cut through the upper pelvis, and it cannot be well
assessed on the current study. No anterior abdominal wall hernia is
noted. Mild degenerative disc disease is present at L5-S1.
IMPRESSION: 1. No explanation for the patient's nausea and abdominal pain is
seen.
2. No renal calculi.  No hydronephrosis.
3. Mild degenerative disc disease at L5-S1.
4. The upper most portion of the appendix is only partially imaged
on the lowest scans and the appendix therefore cannot be assessed.

## 2013-11-15 MED ORDER — IOHEXOL 300 MG/ML  SOLN
100.0000 mL | Freq: Once | INTRAMUSCULAR | Status: AC | PRN
  Administered 2013-11-15: 100 mL via INTRAVENOUS

## 2013-11-16 ENCOUNTER — Encounter: Payer: Self-pay | Admitting: *Deleted

## 2013-11-21 ENCOUNTER — Ambulatory Visit (INDEPENDENT_AMBULATORY_CARE_PROVIDER_SITE_OTHER): Payer: BC Managed Care – PPO | Admitting: Gastroenterology

## 2013-11-21 ENCOUNTER — Encounter: Payer: Self-pay | Admitting: Gastroenterology

## 2013-11-21 VITALS — BP 134/72 | HR 90 | Ht 61.0 in | Wt 166.0 lb

## 2013-11-21 DIAGNOSIS — K2100 Gastro-esophageal reflux disease with esophagitis, without bleeding: Secondary | ICD-10-CM

## 2013-11-21 DIAGNOSIS — M546 Pain in thoracic spine: Secondary | ICD-10-CM

## 2013-11-21 DIAGNOSIS — R110 Nausea: Secondary | ICD-10-CM

## 2013-11-21 DIAGNOSIS — R1011 Right upper quadrant pain: Secondary | ICD-10-CM

## 2013-11-21 NOTE — Patient Instructions (Signed)
Increase the over the counter Prilosec 20 mg to twice daily.  Before breakfast and before dinner. We do suggest you have a colonoscopy.  You can call us when your situation permits to schedule a nurse visit and the colonoscopy. ( 2 appointments)..  I your symptoms are better you will not have to see a PA or Doctor to schedule the procedure. If your still having problems , make an appointment to see a PA or Doctor here at [place].

## 2013-11-21 NOTE — Progress Notes (Signed)
Reviewed and agree.

## 2013-11-21 NOTE — Progress Notes (Addendum)
11/21/2013 Anne Simpson [id number] [birth year]   HISTORY OF PRESENT ILLNESS:  This is a pleasant 58 year old female who is previously known to Dr. Olevia Perches for treatment of GERD. She underwent EGD in January 2002 at which time she was found to have reflux esophagitis. She has been taking Prilosec OTC 20 mg daily for her symptoms and using Pepcid 10 as needed.  She presents to our office today with complaints of ongoing nausea. She states that about 6 weeks ago she began having right sided back/flank pain. She though that she pulled a muscle, but then she began to experience nausea with the pain as well. At one point the nausea would become intense very quickly, but then resolved quickly as well.  Says that the nausea was worse than what she remembers when she was pregnant.  She's also noticed some right upper quadrant abdominal pain under her ribs at times. She's had increase in belching. The pains have since resolved as of now, however, the nausea persists to a much lesser degree compared to previously. Unfortunately, she has her mother at home under hospice care, which is preccluding her from having any extensive testing done currently. We also discussed her need for colonoscopy since her brother had colon cancer in his 59s. Her last colonoscopy was several years ago, in fact, it is not even currently in our EPIC system. She knows that she must schedule that soon when it become convenient for her.  Ultrasound of the abdomen earlier this month showed a 4 mm gallbladder polyp and fatty infiltration of the liver. CT scan of the abdomen with contrast showed mild degenerative disc disease at L5-S1 without any other issues identified. Urinalysis, amylase, lipase, CMP, CBC are all within normal limits.   Past Medical History  Diagnosis Date  . Colon polyp 06/09/2001    HYPERPLASTIC POLYP  . Reflux esophagitis   . Pain in thoracic spine   . Esophageal reflux   . Essential hypertension, benign   .  Fatty liver 11/02/2013    [place]  . Polyp of gallbladder 11/02/2013    [place]   . Hypothyroidism   . Osteopenia    Past Surgical History  Procedure Laterality Date  . Colonoscopy    . Esophagogastroduodenoscopy  05/04/2000    reports that she has never smoked. She has never used smokeless tobacco. She reports that she does not drink alcohol or use illicit drugs. family history includes Colon cancer (age of onset: 85) in her brother; Colon polyps in her brother and mother; Diabetes in her brother; Irritable bowel syndrome in her mother. There is no history of Stomach cancer or Esophageal cancer. Allergies  Allergen Reactions  . Clindamycin/Lincomycin Itching      Outpatient Encounter Prescriptions as of 11/21/2013  Medication Sig  . estradiol (ESTRACE) 0.1 MG/GM vaginal cream Place vaginally as directed.  . famotidine (PEPCID AC) 10 MG chewable tablet Chew 10 mg by mouth as needed for heartburn.  . levothyroxine (SYNTHROID, LEVOTHROID) 50 MCG tablet One tablet by mouth once daily  . loratadine (CLARITIN) 10 MG tablet Take 10 mg by mouth daily.  Marland Kitchen losartan (COZAAR) 100 MG tablet !/2 tablet by mouth twice daily  . Omega-3 Fatty Acids (FISH OIL PO) Take by mouth as directed.  Marland Kitchen omeprazole (PRILOSEC) 20 MG capsule Take 20 mg by mouth daily.     REVIEW OF SYSTEMS  : All other systems reviewed and negative except where noted in the History of Present Illness.  PHYSICAL EXAM: BP 134/72  Pulse 90  Ht 5\' 1"  (1.549 m)  Wt 166 lb (75.297 kg)  BMI 31.38 kg/m2 General: Well developed white female in no acute distress Head: Normocephalic and atraumatic Eyes:  Sclerae anicteric, conjunctiva pink. Ears: Normal auditory acuity  Lungs: Clear throughout to auscultation Heart: Regular rate and rhythm Abdomen: Soft, non-distended.  Normal bowel sounds.  Non-tender. Musculoskeletal: Symmetrical with no gross deformities  Skin: No lesions on visible extremities Extremities: No edema  Neurological:  Alert oriented x 4, grossly non-focal Psychological:  Alert and cooperative. Normal mood and affect  ASSESSMENT AND PLAN: -Nausea, also was having some RUQ abdominal pain and right back pain, both of which have resolved but some nausea persists:  ? If this is due to GERD vs possible gallbladder etiology.  For now we will just plan to increase her prilosec to 20 mg BID since she does have a history of reflux and esophagitis.  I discussed the option of EGD if symptoms persist despite the increase in medication.  As stated below, she has some social situations currently which are preventing her from undergoing testing currently.  Also could consider HIDA scan as well, particularly if pain returns. -Family history of colon cancer in her brother in his 75's:  Her last colonoscopy was several years ago.  I strongly encouraged her to have this performed in the near future, however, due to social situations at home she cannot schedule it currently.  She says that she will return to schedule in the near future.

## 2013-11-28 ENCOUNTER — Encounter: Payer: Self-pay | Admitting: Internal Medicine

## 2014-03-15 ENCOUNTER — Other Ambulatory Visit: Payer: Self-pay | Admitting: Dermatology

## 2014-08-20 ENCOUNTER — Telehealth: Payer: Self-pay | Admitting: Internal Medicine

## 2014-08-20 NOTE — Telephone Encounter (Signed)
Rectal pain and bleeding. Feels like rectum is on fire. She has changed her diet but still having this happen. It bleeds when she wipes. Also, nausea recently.Scheduled with Dr. Olevia Perches on 08/21/14 at 8:45 AM.

## 2014-08-21 ENCOUNTER — Ambulatory Visit (INDEPENDENT_AMBULATORY_CARE_PROVIDER_SITE_OTHER): Payer: BLUE CROSS/BLUE SHIELD | Admitting: Internal Medicine

## 2014-08-21 ENCOUNTER — Encounter: Payer: Self-pay | Admitting: Internal Medicine

## 2014-08-21 VITALS — BP 138/80 | HR 84 | Ht 61.0 in | Wt 173.2 lb

## 2014-08-21 DIAGNOSIS — K625 Hemorrhage of anus and rectum: Secondary | ICD-10-CM

## 2014-08-21 DIAGNOSIS — Z800 Family history of malignant neoplasm of digestive organs: Secondary | ICD-10-CM

## 2014-08-21 DIAGNOSIS — R1011 Right upper quadrant pain: Secondary | ICD-10-CM | POA: Diagnosis not present

## 2014-08-21 DIAGNOSIS — K824 Cholesterolosis of gallbladder: Secondary | ICD-10-CM

## 2014-08-21 MED ORDER — ONDANSETRON HCL 8 MG PO TABS: 8.0000 mg | ORAL_TABLET | Freq: Two times a day (BID) | ORAL | Status: DC

## 2014-08-21 MED ORDER — LIDOCAINE (ANORECTAL) 5 % EX CREA: TOPICAL_CREAM | CUTANEOUS | Status: DC

## 2014-08-21 MED ORDER — DICYCLOMINE HCL 10 MG PO CAPS: 10.0000 mg | ORAL_CAPSULE | Freq: Two times a day (BID) | ORAL | Status: DC | PRN

## 2014-08-21 MED ORDER — NA SULFATE-K SULFATE-MG SULF 17.5-3.13-1.6 GM/177ML PO SOLN
1.0000 | Freq: Once | ORAL | Status: DC
Start: 2014-08-21 — End: 2014-10-17

## 2014-08-21 NOTE — Progress Notes (Signed)
Anne Simpson [birth year] [id number]  Note: This dictation was prepared with Dragon digital system. Any transcriptional errors that result from this procedure are unintentional.   History of Present Illness: This is a 59 year old white female with several gastrointestinal issues. She has had chronic gastroesophageal reflux disease which is currently under great control with Prevacid 30 mg daily and occasional Pepcid. For past several months she has been experiencing bloating and right upper quadrant abdominal discomfort radiating to her back. She has a lot of belching, indigestion and also a positive family history of gallbladder disease in her mother and her niece. Upper abdominal ultrasound in July 2015 revealed gallbladder polyp and fatty liver.  Apart from that she's been having stool leakage and rectal irritation resulting in small amount of blood when she wipes. She is having 4-5 urgent bowel movements a day. She denies any nocturnal diarrhea. She has not taken antibiotics. Her weight has been stable. She is so leaving for [place] for 4 days tomorrow and is concern about getting sick while on her trip. Liver function tests in 2011 showed elevated alkaline phosphatase. Upper endoscopy in 2002 revealed esophagitis . Last colonoscopy in February 2003 showed all hyperplastic polyp which was removed. Her brother had colon cancer and she was due for colonoscopy in 2008 but never scheduled.    Past Medical History  Diagnosis Date  . Colon polyp 06/09/2001    HYPERPLASTIC POLYP  . Reflux esophagitis   . Pain in thoracic spine   . Esophageal reflux   . Essential hypertension, benign   . Fatty liver 11/02/2013    [place]  . Polyp of gallbladder 11/02/2013    [place]   . Hypothyroidism   . Osteopenia     Past Surgical History  Procedure Laterality Date  . Colonoscopy    . Esophagogastroduodenoscopy  05/04/2000    Allergies  Allergen Reactions  . Clindamycin/Lincomycin Itching    Family history  and social history have been reviewed.  Review of Systems: Rectal leakage. History of hemorrhoids. Small amount of rectal bleeding. Gastroesophageal reflux. Occasional nausea  The remainder of the 10 point ROS is negative except as outlined in the H&P  Physical Exam: General Appearance Well developed, in no distress Eyes  Non icteric  HEENT  Non traumatic, normocephalic  Mouth No lesion, tongue papillated, no cheilosis Neck Supple without adenopathy, thyroid not enlarged, no carotid bruits, no JVD Lungs Clear to auscultation bilaterally COR Normal S1, normal S2, regular rhythm, no murmur, quiet precordium Abdomen tender in left lower quadrant and left middle quadrant. Normal right upper quadrant with liver edge at costal margin. No CVA tenderness Rectal external hemorrhoidal tag. Normal rectal sphincter tone. Small amount of Hemoccult negative stool. Palpable internal hemorrhoids Extremities  No pedal edema Skin No lesions Neurological Alert and oriented x 3 Psychological Normal mood and affect  Assessment and Plan:   59 year old white female with the bloating, dyspepsia and family history of gallbladder disease. Gallbladder polyp on ultrasound 6 months ago. We will proceed with a HIDA scan with CCK. She has a history of elevated alkaline phosphatase but was told to have normal liver test on recent blood work by [employer]. We will obtain those results   Colorectal screening. Last colonoscopy in 2003. Positive family history of colon cancer in her brother at age 53. She had a personal history of hyperplastic polyp. We will go and schedule colonoscopy  Rectal irritation and  leakage may be related to symptomatic hemorrhoids. She will use recticare 3 times  a day when necessary Bentyl 10 mg twice a day when necessary fecal urgency  Gastroesophageal reflux disease under good control with PPI and Pepcid       Delfin Edis 08/21/2014

## 2014-08-21 NOTE — Patient Instructions (Addendum)
You have been scheduled for a HIDA scan at [facility] (1st floor) on 09/07/2014. Please arrive 30 minutes prior to your scheduled appointment at  4:03KV. Make certain not to have anything to eat or drink at least 6 hours prior to your test. Should this appointment date or time not work well for you, please call radiology scheduling at [phone].  _____________________________________________________________________ hepatobiliary (HIDA) scan is an imaging procedure used to diagnose problems in the liver, gallbladder and bile ducts. In the HIDA scan, a radioactive chemical or tracer is injected into a vein in your arm. The tracer is handled by the liver like bile. Bile is a fluid produced and excreted by your liver that helps your digestive system break down fats in the foods you eat. Bile is stored in your gallbladder and the gallbladder releases the bile when you eat a meal. A special nuclear medicine scanner (gamma camera) tracks the flow of the tracer from your liver into your gallbladder and small intestine.  During your HIDA scan  You'll be asked to change into a hospital gown before your HIDA scan begins. Your health care team will position you on a table, usually on your back. The radioactive tracer is then injected into a vein in your arm.The tracer travels through your bloodstream to your liver, where it's taken up by the bile-producing cells. The radioactive tracer travels with the bile from your liver into your gallbladder and through your bile ducts to your small intestine.You may feel some pressure while the radioactive tracer is injected into your vein. As you lie on the table, a special gamma camera is positioned over your abdomen taking pictures of the tracer as it moves through your body. The gamma camera takes pictures continually for about an hour. You'll need to keep still during the HIDA scan. This can become uncomfortable, but you may find that you can lessen the discomfort by  taking deep breaths and thinking about other things. Tell your health care team if you're uncomfortable. The radiologist will watch on a computer the progress of the radioactive tracer through your body. The HIDA scan may be stopped when the radioactive tracer is seen in the gallbladder and enters your small intestine. This typically takes about an hour. In some cases extra imaging will be performed if original images aren't satisfactory, if morphine is given to help visualize the gallbladder or if the medication CCK is given to look at the contraction of the gallbladder. This test typically takes 2 hours to complete. ________________________________________________________________________   Anne Simpson have been scheduled for a colonoscopy. Please follow written instructions given to you at your visit today.  Please pick up your prep supplies at the pharmacy within the next 1-3 days. If you use inhalers (even only as needed), please bring them with you on the day of your procedure. Your physician has requested that you go to www.[email or web address].com and enter the access code given to you at your visit today. This web site gives a general overview about your procedure. However, you should still follow specific instructions given to you by our office regarding your preparation for the procedure.   Your prescriptions are being sent to your pharmacy ([facility], Bentyl Zofran)  Dr Dagmar Hait

## 2014-09-07 ENCOUNTER — Encounter (HOSPITAL_COMMUNITY)
Admission: RE | Admit: 2014-09-07 | Discharge: 2014-09-07 | Disposition: A | Discharge status: Home / Self Care | Payer: BLUE CROSS/BLUE SHIELD | Source: Ambulatory Visit | Attending: Internal Medicine | Admitting: Internal Medicine

## 2014-09-07 DIAGNOSIS — R1011 Right upper quadrant pain: Secondary | ICD-10-CM | POA: Diagnosis not present

## 2014-09-07 DIAGNOSIS — K824 Cholesterolosis of gallbladder: Secondary | ICD-10-CM

## 2014-09-07 IMAGING — NM NM HEPATO W/GB/PHARM/[PERSON_NAME]
1 series · 12 of 12 positions shown · non-contrast
Comparison: CT scan [DATE]

CLINICAL DATA: Right upper quadrant pain

EXAM:
NUCLEAR MEDICINE HEPATOBILIARY IMAGING WITH GALLBLADDER EF
TECHNIQUE: Sequential images of the abdomen were obtained [DATE] minutes
following intravenous administration of radiopharmaceutical. After
slow intravenous infusion of 1.6 micrograms Cholecystokinin,
gallbladder ejection fraction was determined.
RADIOPHARMACEUTICALS:  5.4 mCi  [IV] Choletec IV

[Series 1: hepato · 4.46mm/px · 2 acquisitions, 12 frames shown]
[im 1/2]
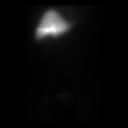
[im 1/2]
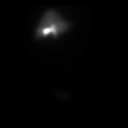
[im 1/2]
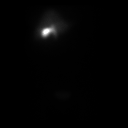
[im 1/2]
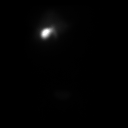
[im 1/2]
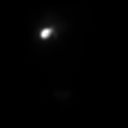
[im 1/2]
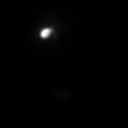
[im 2/2]
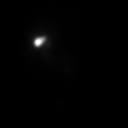
[im 2/2]
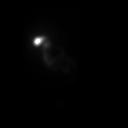
[im 2/2]
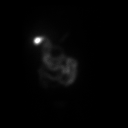
[im 2/2]
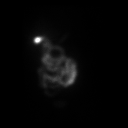
[im 2/2]
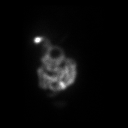
[im 2/2]
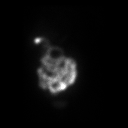

[12 of 12 positions shown; findings below may reference images not displayed]

FINDINGS: There is normal uptake of the tracer by the liver. Gallbladder and
CBD visualized at 20 minutes. Post CCK gallbladder ejection fraction
is 91. 2%.. At 45 min, normal ejection fraction is greater than 40%.

The patient reported no symptoms for CCK administration.
IMPRESSION: No cystic duct obstruction. Post CCK gallbladder ejection fraction
is 91.2%.

## 2014-09-07 MED ORDER — SINCALIDE 5 MCG IJ SOLR
0.0200 ug/kg | Freq: Once | INTRAMUSCULAR | Status: AC
  Administered 2014-09-07: 1.6 ug via INTRAVENOUS

## 2014-09-10 ENCOUNTER — Telehealth: Payer: Self-pay | Admitting: *Deleted

## 2014-09-10 NOTE — Telephone Encounter (Signed)
If she is interested, we may send Zofran 4 mg, #30, 1 po q8 hrs prn nausea, 1 refill.

## 2014-09-10 NOTE — Telephone Encounter (Signed)
Left a message for patient to call back. 

## 2014-09-10 NOTE — Telephone Encounter (Signed)
Patient states she is still having nausea but does not have burning, leakage from rectum.  She is not taking any medications for nausea.

## 2014-09-11 NOTE — Telephone Encounter (Signed)
Patient given recommendations. She has Zofran at this time.

## 2014-10-15 ENCOUNTER — Other Ambulatory Visit: Payer: Self-pay | Admitting: Internal Medicine

## 2014-10-15 DIAGNOSIS — R140 Abdominal distension (gaseous): Secondary | ICD-10-CM

## 2014-10-17 ENCOUNTER — Ambulatory Visit (AMBULATORY_SURGERY_CENTER): Payer: BLUE CROSS/BLUE SHIELD | Admitting: Internal Medicine

## 2014-10-17 ENCOUNTER — Encounter: Payer: Self-pay | Admitting: Internal Medicine

## 2014-10-17 VITALS — BP 140/72 | HR 71 | Temp 98.1°F | Resp 18 | Ht 61.0 in | Wt 173.0 lb

## 2014-10-17 DIAGNOSIS — Z1211 Encounter for screening for malignant neoplasm of colon: Secondary | ICD-10-CM | POA: Diagnosis not present

## 2014-10-17 DIAGNOSIS — D129 Benign neoplasm of anus and anal canal: Secondary | ICD-10-CM

## 2014-10-17 DIAGNOSIS — D125 Benign neoplasm of sigmoid colon: Secondary | ICD-10-CM | POA: Diagnosis not present

## 2014-10-17 DIAGNOSIS — D120 Benign neoplasm of cecum: Secondary | ICD-10-CM

## 2014-10-17 DIAGNOSIS — K621 Rectal polyp: Secondary | ICD-10-CM | POA: Diagnosis not present

## 2014-10-17 DIAGNOSIS — D122 Benign neoplasm of ascending colon: Secondary | ICD-10-CM

## 2014-10-17 DIAGNOSIS — Z800 Family history of malignant neoplasm of digestive organs: Secondary | ICD-10-CM | POA: Diagnosis not present

## 2014-10-17 DIAGNOSIS — D128 Benign neoplasm of rectum: Secondary | ICD-10-CM

## 2014-10-17 MED ORDER — SODIUM CHLORIDE 0.9 % IV SOLN: 500.0000 mL | INTRAVENOUS | Status: DC

## 2014-10-17 NOTE — Progress Notes (Signed)
A/ox3 pleased with MAC, report to Jane RN 

## 2014-10-17 NOTE — Patient Instructions (Signed)
YOU HAD AN ENDOSCOPIC PROCEDURE TODAY AT THE [facility]:   Refer to the procedure report that was given to you for any specific questions about what was found during the examination.  If the procedure report does not answer your questions, please call your gastroenterologist to clarify.  If you requested that your care partner not be given the details of your procedure findings, then the procedure report has been included in a sealed envelope for you to review at your convenience later.  YOU SHOULD EXPECT: Some feelings of bloating in the abdomen. Passage of more gas than usual.  Walking can help get rid of the air that was put into your GI tract during the procedure and reduce the bloating. If you had a lower endoscopy (such as a colonoscopy or flexible sigmoidoscopy) you may notice spotting of blood in your stool or on the toilet paper. If you underwent a bowel prep for your procedure, you may not have a normal bowel movement for a few days.  Please Note:  You might notice some irritation and congestion in your nose or some drainage.  This is from the oxygen used during your procedure.  There is no need for concern and it should clear up in a day or so.  SYMPTOMS TO REPORT IMMEDIATELY:   Following lower endoscopy (colonoscopy or flexible sigmoidoscopy):  Excessive amounts of blood in the stool  Significant tenderness or worsening of abdominal pains  Swelling of the abdomen that is new, acute  Fever of 100F or higher   Following upper endoscopy (EGD)  Vomiting of blood or coffee ground material  New chest pain or pain under the shoulder blades  Painful or persistently difficult swallowing  New shortness of breath  Fever of 100F or higher  Black, tarry-looking stools  For urgent or emergent issues, a gastroenterologist can be reached at any hour by calling [phone].   DIET: Your first meal following the procedure should be a small meal and then it is ok to progress to  your normal diet. Heavy or fried foods are harder to digest and may make you feel nauseous or bloated.  Likewise, meals heavy in dairy and vegetables can increase bloating.  Drink plenty of fluids but you should avoid alcoholic beverages for 24 hours.  ACTIVITY:  You should plan to take it easy for the rest of today and you should NOT DRIVE or use heavy machinery until tomorrow (because of the sedation medicines used during the test).    FOLLOW UP: Our staff will call the number listed on your records the next business day following your procedure to check on you and address any questions or concerns that you may have regarding the information given to you following your procedure. If we do not reach you, we will leave a message.  However, if you are feeling well and you are not experiencing any problems, there is no need to return our call.  We will assume that you have returned to your regular daily activities without incident.  If any biopsies were taken you will be contacted by phone or by letter within the next 1-3 weeks.  Please call us at [phone] if you have not heard about the biopsies in 3 weeks.    SIGNATURES/CONFIDENTIALITY: You and/or your care partner have signed paperwork which will be entered into your electronic medical record.  These signatures attest to the fact that that the information above on your After Visit Summary has been reviewed   and is understood.  Full responsibility of the confidentiality of this discharge information lies with you and/or your care-partner.YOU HAD AN ENDOSCOPIC PROCEDURE TODAY AT [facility]:   Refer to the procedure report that was given to you for any specific questions about what was found during the examination.  If the procedure report does not answer your questions, please call your gastroenterologist to clarify.  If you requested that your care partner not be given the details of your procedure findings, then the procedure  report has been included in a sealed envelope for you to review at your convenience later.  YOU SHOULD EXPECT: Some feelings of bloating in the abdomen. Passage of more gas than usual.  Walking can help get rid of the air that was put into your GI tract during the procedure and reduce the bloating. If you had a lower endoscopy (such as a colonoscopy or flexible sigmoidoscopy) you may notice spotting of blood in your stool or on the toilet paper. If you underwent a bowel prep for your procedure, you may not have a normal bowel movement for a few days.  Please Note:  You might notice some irritation and congestion in your nose or some drainage.  This is from the oxygen used during your procedure.  There is no need for concern and it should clear up in a day or so.  SYMPTOMS TO REPORT IMMEDIATELY:   Following lower endoscopy (colonoscopy or flexible sigmoidoscopy):  Excessive amounts of blood in the stool  Significant tenderness or worsening of abdominal pains  Swelling of the abdomen that is new, acute  Fever of 100F or higher   For urgent or emergent issues, a gastroenterologist can be reached at any hour by calling [phone].   DIET: Your first meal following the procedure should be a small meal and then it is ok to progress to your normal diet. Heavy or fried foods are harder to digest and may make you feel nauseous or bloated.  Likewise, meals heavy in dairy and vegetables can increase bloating.  Drink plenty of fluids but you should avoid alcoholic beverages for 24 hours.  ACTIVITY:  You should plan to take it easy for the rest of today and you should NOT DRIVE or use heavy machinery until tomorrow (because of the sedation medicines used during the test).    FOLLOW UP: Our staff will call the number listed on your records the next business day following your procedure to check on you and address any questions or concerns that you may have regarding the information given to you  following your procedure. If we do not reach you, we will leave a message.  However, if you are feeling well and you are not experiencing any problems, there is no need to return our call.  We will assume that you have returned to your regular daily activities without incident.  If any biopsies were taken you will be contacted by phone or by letter within the next 1-3 weeks.  Please call us at [phone] if you have not heard about the biopsies in 3 weeks.    SIGNATURES/CONFIDENTIALITY: You and/or your care partner have signed paperwork which will be entered into your electronic medical record.  These signatures attest to the fact that that the information above on your After Visit Summary has been reviewed and is understood.  Full responsibility of the confidentiality of this discharge information lies with you and/or your care-partner.  Polyps and high fiber diet  information given.

## 2014-10-17 NOTE — Op Note (Signed)
[place]  [address] [place], [id number]   COLONOSCOPY PROCEDURE REPORT  PATIENT: Anne, Simpson  MR#: [id number] BIRTHDATE: 1955-10-10 , 78  yrs. old GENDER: female ENDOSCOPIST: Lafayette Dragon, MD REFERRED [id number] Avva, M.D. PROCEDURE DATE:  10/17/2014 PROCEDURE:   Colonoscopy, screening and Colonoscopy with cold biopsy polypectomy First Screening Colonoscopy - Avg.  risk and is 50 yrs.  old or older - No.  Prior Negative Screening - Now for repeat screening. N/A  History of Adenoma - Now for follow-up colonoscopy & has been > or = to 3 yrs.  N/A  Polyps removed today? Yes ASA CLASS:   Class II INDICATIONS:Screening for colonic neoplasia, FH Colon or Rectal Adenocarcinoma, and prior colonoscopy in 2003 hyperplastic polyps. Brother with colon cancer.  Mother with colon polyps. MEDICATIONS: Monitored anesthesia care and Propofol 200 mg IV  DESCRIPTION OF PROCEDURE:   After the risks benefits and alternatives of the procedure were thoroughly explained, informed consent was obtained.  The digital rectal exam revealed no abnormalities of the rectum.   The LB [id number]  endoscope was introduced through the anus and advanced to the cecum, which was identified by both the appendix and ileocecal valve. No adverse events experienced.   The quality of the prep was good.  (MoviPrep was used)  The instrument was then slowly withdrawn as the colon was fully examined. Estimated blood loss is zero unless otherwise noted in this procedure report.      COLON FINDINGS: Four firm sessile polyps measuring 4 mm in size were found in the ascending colon, at the cecum, and in the rectum.  A polypectomy was performed with cold forceps.  The resection was complete, the polyp tissue was completely retrieved and sent to histology.  Retroflexed views revealed no abnormalities. The time to cecum = 3.32 Withdrawal time = 10.02   The scope was withdrawn and the procedure  completed. COMPLICATIONS: There were no immediate complications.  ENDOSCOPIC IMPRESSION: Four sessile polyps were found in the ascending colon,x1  at the cecum,x1  and in the rectum x2  polypectomy was performed with cold forceps  RECOMMENDATIONS: 1.  Await pathology results 2.  High fiber diet Recall colonoscopy in 5 years  eSigned:  Lafayette Dragon, MD 10/17/2014 2:01 PM   cc:

## 2014-10-17 NOTE — Progress Notes (Signed)
Called to room to assist during endoscopic procedure.  Patient ID and intended procedure confirmed with present staff. Received instructions for my participation in the procedure from the performing physician.  

## 2014-10-18 ENCOUNTER — Other Ambulatory Visit: Payer: BLUE CROSS/BLUE SHIELD

## 2014-10-18 ENCOUNTER — Telehealth: Payer: Self-pay | Admitting: Emergency Medicine

## 2014-10-18 NOTE — Telephone Encounter (Signed)
  Follow up Call-  Call back number 10/17/2014  Permission to leave phone message Yes     Patient questions:  Do you have a fever, pain , or abdominal swelling? No. Pain Score  0 *  Have you tolerated food without any problems? Yes.    Have you been able to return to your normal activities? Yes.    Do you have any questions about your discharge instructions: Diet   No. Medications  No. Follow up visit  No.  Do you have questions or concerns about your Care? No.  Actions: * If pain score is 4 or above: No action needed, pain <4.

## 2014-10-22 ENCOUNTER — Encounter: Payer: Self-pay | Admitting: Internal Medicine

## 2014-11-19 ENCOUNTER — Ambulatory Visit
Admission: RE | Admit: 2014-11-19 | Discharge: 2014-11-19 | Disposition: A | Discharge status: Home / Self Care | Payer: BLUE CROSS/BLUE SHIELD | Source: Ambulatory Visit | Attending: Internal Medicine | Admitting: Internal Medicine

## 2014-11-19 ENCOUNTER — Other Ambulatory Visit: Payer: Self-pay | Admitting: Internal Medicine

## 2014-11-19 DIAGNOSIS — R140 Abdominal distension (gaseous): Secondary | ICD-10-CM

## 2014-11-19 IMAGING — US US ABDOMEN LIMITED
1 series · 14 of 25 positions shown · non-contrast
Comparison: Abdominal ultrasound [DATE] revealing a 4 mm
gallbladder polyp as well as fatty infiltration of the liver.

CLINICAL DATA: Abdominal bloating, known gallbladder polyp, recent
normal hepatobiliary scan

EXAM:
US ABDOMEN LIMITED - RIGHT UPPER QUADRANT

[Series 1: us abdomen limited · 0.28mm/px · 14 of 38 slices shown]
[im 1/38]
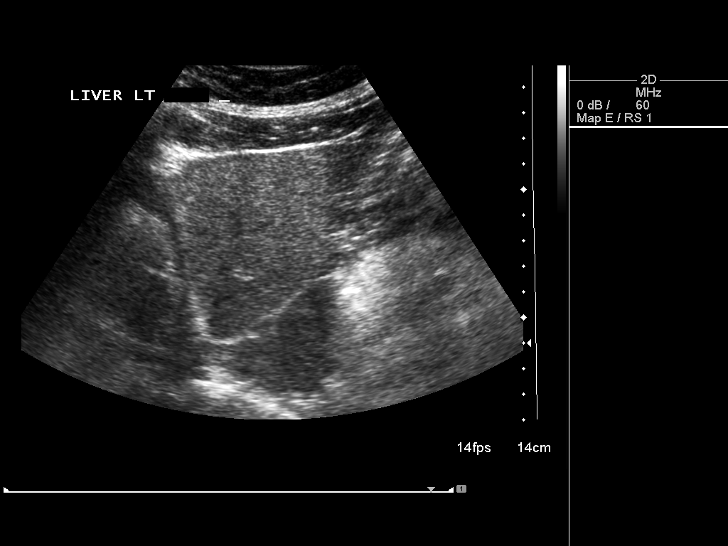
[im 4/38]
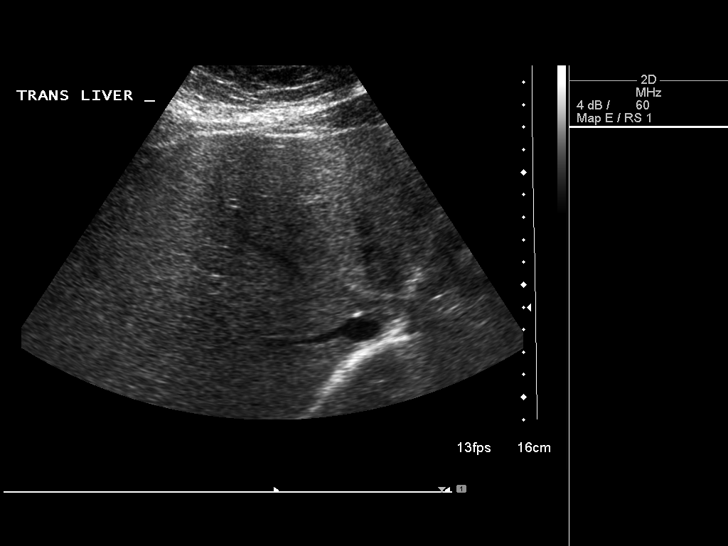
[im 7/38]
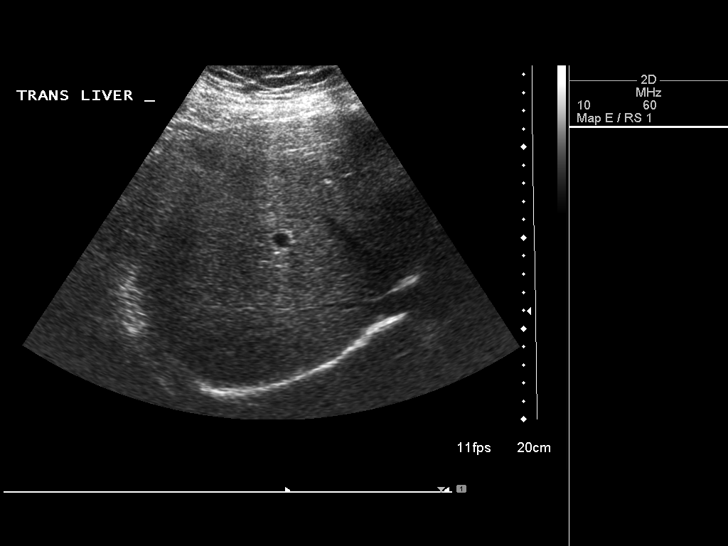
[im 10/38]
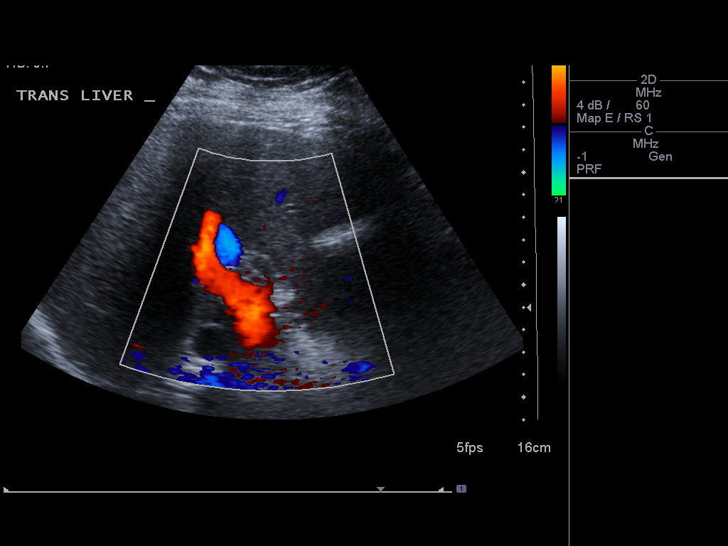
[im 13/38]
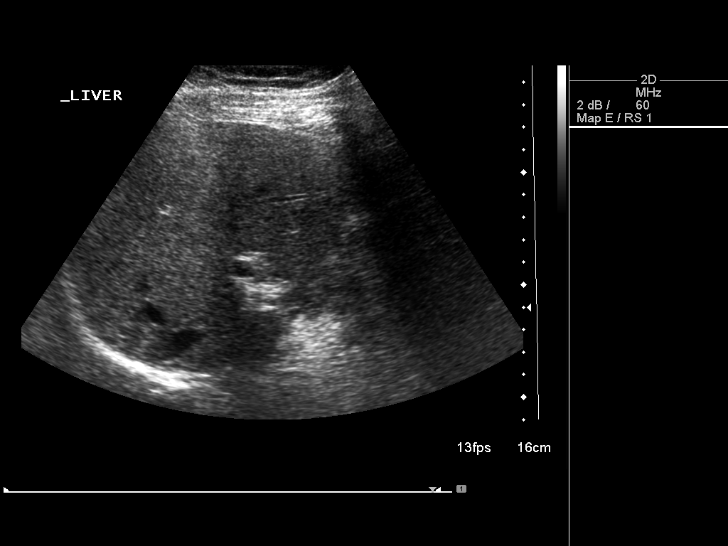
[im 14/38]
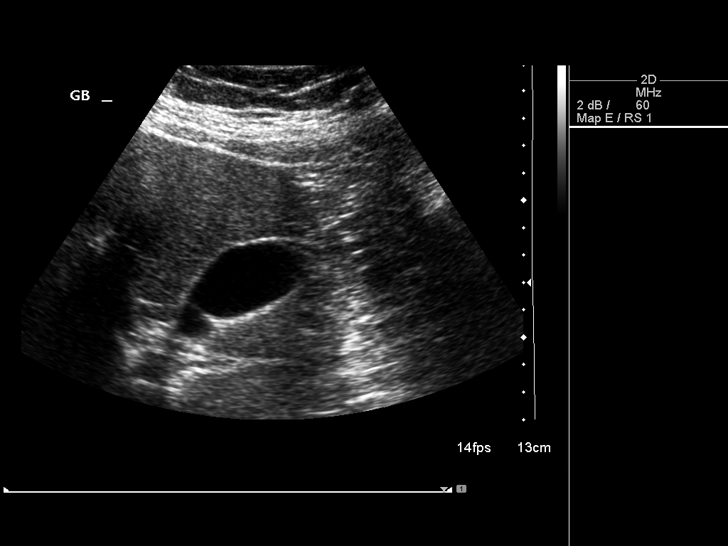
[im 17/38]
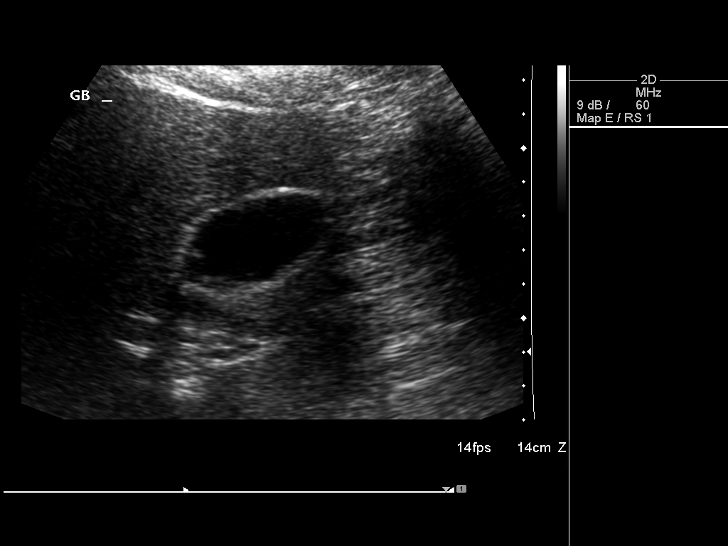
[im 21/38]
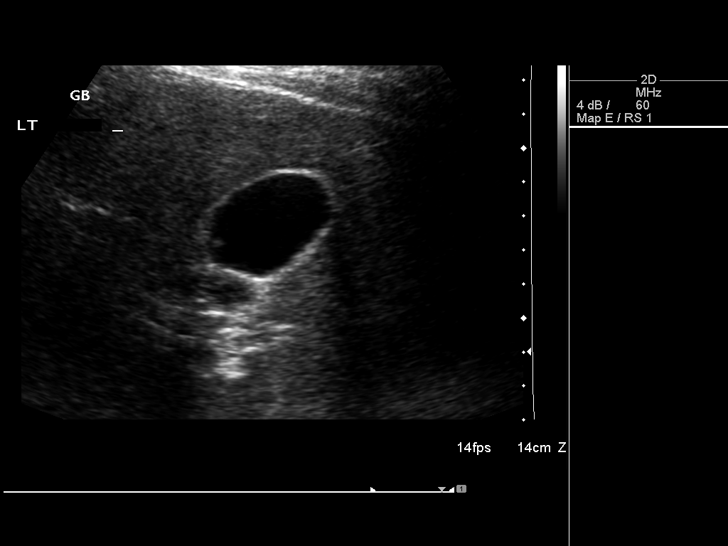
[im 24/38]
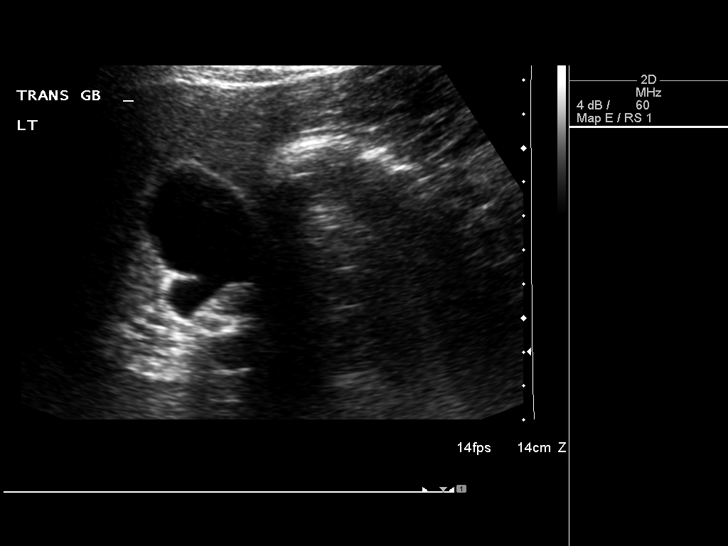
[im 25/38]
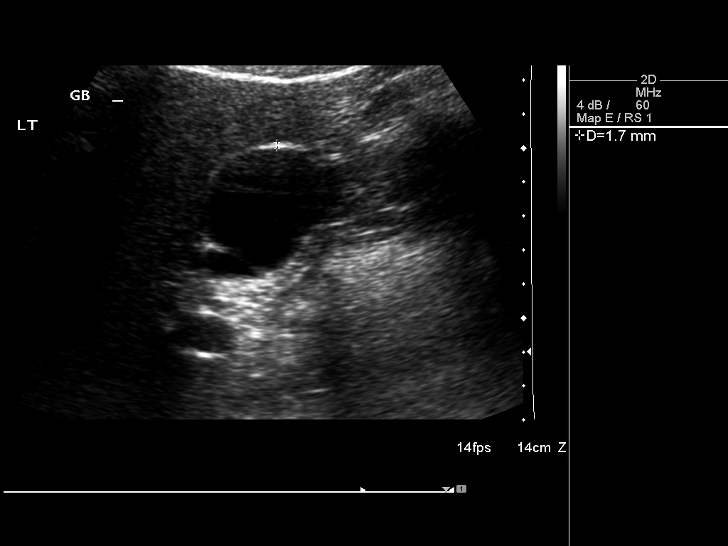
[im 28/38]
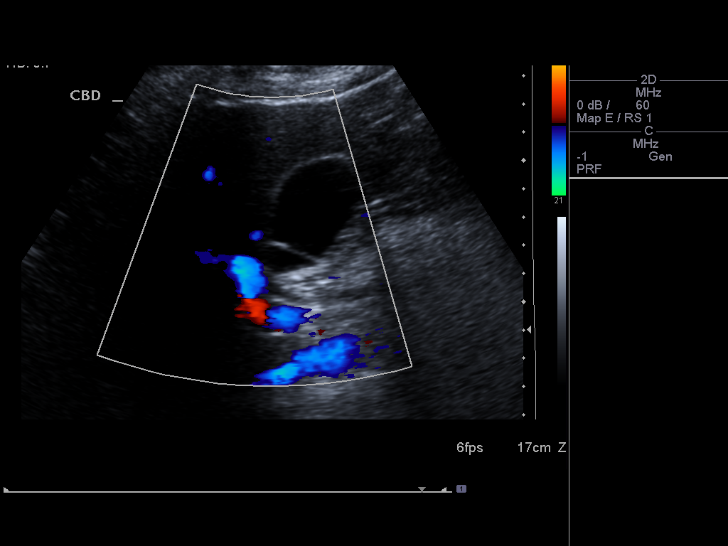
[im 31/38]
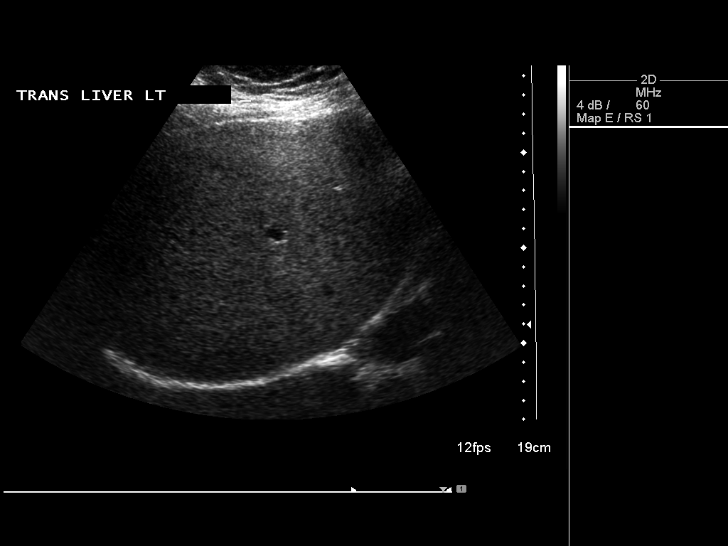
[im 34/38]
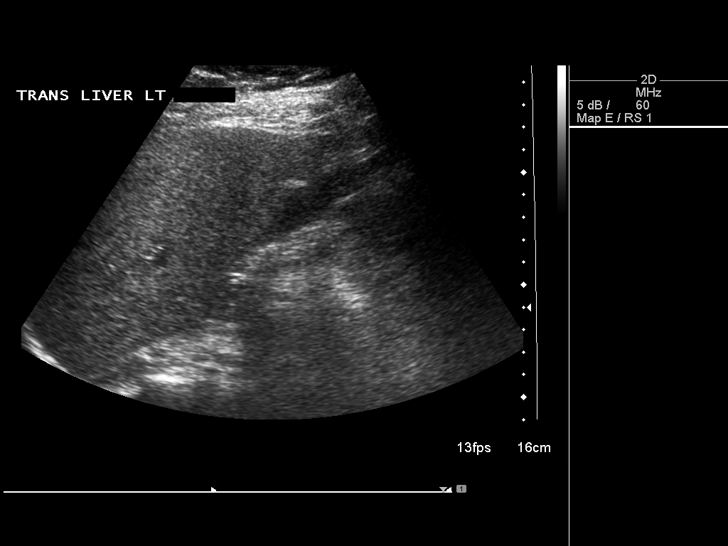
[im 38/38]
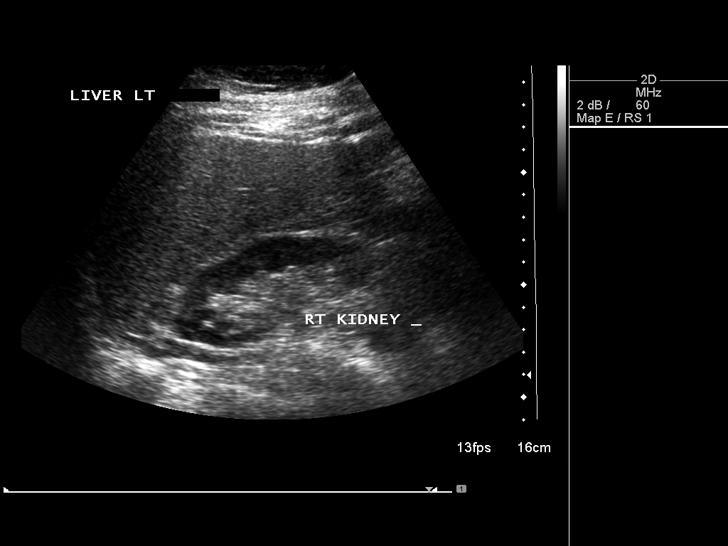

[14 of 25 positions shown; findings below may reference images not displayed]

FINDINGS: Gallbladder:

The gallbladder is adequately distended. Again demonstrated is a 3-4
mm echogenic non shadowing non mobile focus. No mobile shadowing
stones are demonstrated. There is no gallbladder wall thickening,
pericholecystic fluid, or positive sonographic Murphy's sign.

Common bile duct:

Diameter: 3.3 mm

Liver:

The hepatic echotexture is mildly increased. There is no focal mass
or ductal dilation.
IMPRESSION: 1. No sonographic evidence of acute cholecystitis. Stable appearance
of a probable 3-4 mm diameter a polyp.
2. Fatty infiltrative change of the liver.

## 2015-07-31 DIAGNOSIS — H9312 Tinnitus, left ear: Secondary | ICD-10-CM | POA: Diagnosis not present

## 2015-07-31 DIAGNOSIS — H6121 Impacted cerumen, right ear: Secondary | ICD-10-CM | POA: Diagnosis not present

## 2015-07-31 DIAGNOSIS — J342 Deviated nasal septum: Secondary | ICD-10-CM | POA: Diagnosis not present

## 2015-07-31 DIAGNOSIS — L299 Pruritus, unspecified: Secondary | ICD-10-CM | POA: Diagnosis not present

## 2015-07-31 DIAGNOSIS — H903 Sensorineural hearing loss, bilateral: Secondary | ICD-10-CM | POA: Diagnosis not present

## 2015-08-21 DIAGNOSIS — M7662 Achilles tendinitis, left leg: Secondary | ICD-10-CM | POA: Diagnosis not present

## 2015-08-22 DIAGNOSIS — H9312 Tinnitus, left ear: Secondary | ICD-10-CM | POA: Diagnosis not present

## 2015-08-22 DIAGNOSIS — H9122 Sudden idiopathic hearing loss, left ear: Secondary | ICD-10-CM | POA: Diagnosis not present

## 2015-08-22 DIAGNOSIS — H9042 Sensorineural hearing loss, unilateral, left ear, with unrestricted hearing on the contralateral side: Secondary | ICD-10-CM | POA: Diagnosis not present

## 2015-08-26 DIAGNOSIS — E784 Other hyperlipidemia: Secondary | ICD-10-CM | POA: Diagnosis not present

## 2015-08-26 DIAGNOSIS — E038 Other specified hypothyroidism: Secondary | ICD-10-CM | POA: Diagnosis not present

## 2015-08-28 DIAGNOSIS — M25572 Pain in left ankle and joints of left foot: Secondary | ICD-10-CM | POA: Diagnosis not present

## 2015-08-31 DIAGNOSIS — J018 Other acute sinusitis: Secondary | ICD-10-CM | POA: Diagnosis not present

## 2015-09-02 DIAGNOSIS — M7662 Achilles tendinitis, left leg: Secondary | ICD-10-CM | POA: Diagnosis not present

## 2015-09-03 ENCOUNTER — Other Ambulatory Visit: Payer: Self-pay | Admitting: Physician Assistant

## 2015-09-03 DIAGNOSIS — H9312 Tinnitus, left ear: Secondary | ICD-10-CM

## 2015-09-03 DIAGNOSIS — H9120 Sudden idiopathic hearing loss, unspecified ear: Secondary | ICD-10-CM

## 2015-09-05 DIAGNOSIS — J302 Other seasonal allergic rhinitis: Secondary | ICD-10-CM | POA: Diagnosis not present

## 2015-09-05 DIAGNOSIS — R05 Cough: Secondary | ICD-10-CM | POA: Diagnosis not present

## 2015-09-05 DIAGNOSIS — E784 Other hyperlipidemia: Secondary | ICD-10-CM | POA: Diagnosis not present

## 2015-09-05 DIAGNOSIS — E669 Obesity, unspecified: Secondary | ICD-10-CM | POA: Diagnosis not present

## 2015-09-05 DIAGNOSIS — I10 Essential (primary) hypertension: Secondary | ICD-10-CM | POA: Diagnosis not present

## 2015-09-19 DIAGNOSIS — L820 Inflamed seborrheic keratosis: Secondary | ICD-10-CM | POA: Diagnosis not present

## 2016-02-03 DIAGNOSIS — M25571 Pain in right ankle and joints of right foot: Secondary | ICD-10-CM | POA: Diagnosis not present

## 2016-03-17 DIAGNOSIS — E784 Other hyperlipidemia: Secondary | ICD-10-CM | POA: Diagnosis not present

## 2016-03-17 DIAGNOSIS — I10 Essential (primary) hypertension: Secondary | ICD-10-CM | POA: Diagnosis not present

## 2016-03-17 DIAGNOSIS — E038 Other specified hypothyroidism: Secondary | ICD-10-CM | POA: Diagnosis not present

## 2016-03-23 DIAGNOSIS — M545 Low back pain: Secondary | ICD-10-CM | POA: Diagnosis not present

## 2016-03-25 DIAGNOSIS — M545 Low back pain: Secondary | ICD-10-CM | POA: Diagnosis not present

## 2016-04-02 DIAGNOSIS — Z1231 Encounter for screening mammogram for malignant neoplasm of breast: Secondary | ICD-10-CM | POA: Diagnosis not present

## 2016-04-02 DIAGNOSIS — Z6832 Body mass index (BMI) 32.0-32.9, adult: Secondary | ICD-10-CM | POA: Diagnosis not present

## 2016-04-02 DIAGNOSIS — Z01419 Encounter for gynecological examination (general) (routine) without abnormal findings: Secondary | ICD-10-CM | POA: Diagnosis not present

## 2016-04-07 DIAGNOSIS — M545 Low back pain: Secondary | ICD-10-CM | POA: Diagnosis not present

## 2016-04-10 DIAGNOSIS — H40012 Open angle with borderline findings, low risk, left eye: Secondary | ICD-10-CM | POA: Diagnosis not present

## 2016-04-10 DIAGNOSIS — H40011 Open angle with borderline findings, low risk, right eye: Secondary | ICD-10-CM | POA: Diagnosis not present

## 2016-04-10 DIAGNOSIS — Z0100 Encounter for examination of eyes and vision without abnormal findings: Secondary | ICD-10-CM | POA: Diagnosis not present

## 2016-04-13 DIAGNOSIS — E038 Other specified hypothyroidism: Secondary | ICD-10-CM | POA: Diagnosis not present

## 2016-04-13 DIAGNOSIS — E784 Other hyperlipidemia: Secondary | ICD-10-CM | POA: Diagnosis not present

## 2016-04-13 DIAGNOSIS — Z23 Encounter for immunization: Secondary | ICD-10-CM | POA: Diagnosis not present

## 2016-04-13 DIAGNOSIS — I10 Essential (primary) hypertension: Secondary | ICD-10-CM | POA: Diagnosis not present

## 2016-04-13 DIAGNOSIS — E669 Obesity, unspecified: Secondary | ICD-10-CM | POA: Diagnosis not present

## 2016-09-25 DIAGNOSIS — M859 Disorder of bone density and structure, unspecified: Secondary | ICD-10-CM | POA: Diagnosis not present

## 2016-09-25 DIAGNOSIS — E038 Other specified hypothyroidism: Secondary | ICD-10-CM | POA: Diagnosis not present

## 2016-09-25 DIAGNOSIS — I10 Essential (primary) hypertension: Secondary | ICD-10-CM | POA: Diagnosis not present

## 2016-09-25 DIAGNOSIS — E784 Other hyperlipidemia: Secondary | ICD-10-CM | POA: Diagnosis not present

## 2016-09-30 DIAGNOSIS — E669 Obesity, unspecified: Secondary | ICD-10-CM | POA: Diagnosis not present

## 2016-09-30 DIAGNOSIS — E038 Other specified hypothyroidism: Secondary | ICD-10-CM | POA: Diagnosis not present

## 2016-09-30 DIAGNOSIS — E784 Other hyperlipidemia: Secondary | ICD-10-CM | POA: Diagnosis not present

## 2016-09-30 DIAGNOSIS — I10 Essential (primary) hypertension: Secondary | ICD-10-CM | POA: Diagnosis not present

## 2016-12-07 DIAGNOSIS — E038 Other specified hypothyroidism: Secondary | ICD-10-CM | POA: Diagnosis not present

## 2017-03-23 DIAGNOSIS — Z6834 Body mass index (BMI) 34.0-34.9, adult: Secondary | ICD-10-CM | POA: Diagnosis not present

## 2017-03-23 DIAGNOSIS — L0291 Cutaneous abscess, unspecified: Secondary | ICD-10-CM | POA: Diagnosis not present

## 2017-04-02 DIAGNOSIS — I10 Essential (primary) hypertension: Secondary | ICD-10-CM | POA: Diagnosis not present

## 2017-04-02 DIAGNOSIS — Z1389 Encounter for screening for other disorder: Secondary | ICD-10-CM | POA: Diagnosis not present

## 2017-04-02 DIAGNOSIS — E038 Other specified hypothyroidism: Secondary | ICD-10-CM | POA: Diagnosis not present

## 2017-04-02 DIAGNOSIS — L0291 Cutaneous abscess, unspecified: Secondary | ICD-10-CM | POA: Diagnosis not present

## 2017-04-02 DIAGNOSIS — E7849 Other hyperlipidemia: Secondary | ICD-10-CM | POA: Diagnosis not present

## 2017-06-23 DIAGNOSIS — H5213 Myopia, bilateral: Secondary | ICD-10-CM | POA: Diagnosis not present

## 2017-06-23 DIAGNOSIS — H40011 Open angle with borderline findings, low risk, right eye: Secondary | ICD-10-CM | POA: Diagnosis not present

## 2017-08-30 DIAGNOSIS — B9689 Other specified bacterial agents as the cause of diseases classified elsewhere: Secondary | ICD-10-CM | POA: Diagnosis not present

## 2017-08-30 DIAGNOSIS — J3089 Other allergic rhinitis: Secondary | ICD-10-CM | POA: Diagnosis not present

## 2017-08-30 DIAGNOSIS — J208 Acute bronchitis due to other specified organisms: Secondary | ICD-10-CM | POA: Diagnosis not present

## 2017-08-30 DIAGNOSIS — J019 Acute sinusitis, unspecified: Secondary | ICD-10-CM | POA: Diagnosis not present

## 2017-09-15 DIAGNOSIS — I10 Essential (primary) hypertension: Secondary | ICD-10-CM | POA: Diagnosis not present

## 2017-09-15 DIAGNOSIS — J45909 Unspecified asthma, uncomplicated: Secondary | ICD-10-CM | POA: Diagnosis not present

## 2017-09-15 DIAGNOSIS — R05 Cough: Secondary | ICD-10-CM | POA: Diagnosis not present

## 2017-09-15 DIAGNOSIS — J029 Acute pharyngitis, unspecified: Secondary | ICD-10-CM | POA: Diagnosis not present

## 2017-09-28 DIAGNOSIS — E038 Other specified hypothyroidism: Secondary | ICD-10-CM | POA: Diagnosis not present

## 2017-09-28 DIAGNOSIS — I10 Essential (primary) hypertension: Secondary | ICD-10-CM | POA: Diagnosis not present

## 2017-09-28 DIAGNOSIS — E7849 Other hyperlipidemia: Secondary | ICD-10-CM | POA: Diagnosis not present

## 2017-09-29 DIAGNOSIS — I10 Essential (primary) hypertension: Secondary | ICD-10-CM | POA: Diagnosis not present

## 2017-09-29 DIAGNOSIS — J3089 Other allergic rhinitis: Secondary | ICD-10-CM | POA: Diagnosis not present

## 2017-09-29 DIAGNOSIS — R05 Cough: Secondary | ICD-10-CM | POA: Diagnosis not present

## 2017-09-29 DIAGNOSIS — J45998 Other asthma: Secondary | ICD-10-CM | POA: Diagnosis not present

## 2017-10-18 DIAGNOSIS — R51 Headache: Secondary | ICD-10-CM | POA: Diagnosis not present

## 2017-10-18 DIAGNOSIS — J342 Deviated nasal septum: Secondary | ICD-10-CM | POA: Diagnosis not present

## 2017-10-19 DIAGNOSIS — R51 Headache: Secondary | ICD-10-CM | POA: Diagnosis not present

## 2017-10-19 DIAGNOSIS — J329 Chronic sinusitis, unspecified: Secondary | ICD-10-CM | POA: Diagnosis not present

## 2017-11-24 DIAGNOSIS — Z01419 Encounter for gynecological examination (general) (routine) without abnormal findings: Secondary | ICD-10-CM | POA: Diagnosis not present

## 2017-11-24 DIAGNOSIS — Z1231 Encounter for screening mammogram for malignant neoplasm of breast: Secondary | ICD-10-CM | POA: Diagnosis not present

## 2017-11-24 DIAGNOSIS — M8580 Other specified disorders of bone density and structure, unspecified site: Secondary | ICD-10-CM | POA: Diagnosis not present

## 2017-11-24 DIAGNOSIS — Z13820 Encounter for screening for osteoporosis: Secondary | ICD-10-CM | POA: Diagnosis not present

## 2017-11-24 DIAGNOSIS — Z6835 Body mass index (BMI) 35.0-35.9, adult: Secondary | ICD-10-CM | POA: Diagnosis not present

## 2018-02-25 DIAGNOSIS — L821 Other seborrheic keratosis: Secondary | ICD-10-CM | POA: Diagnosis not present

## 2018-02-25 DIAGNOSIS — D2261 Melanocytic nevi of right upper limb, including shoulder: Secondary | ICD-10-CM | POA: Diagnosis not present

## 2018-02-25 DIAGNOSIS — L814 Other melanin hyperpigmentation: Secondary | ICD-10-CM | POA: Diagnosis not present

## 2018-03-28 DIAGNOSIS — E038 Other specified hypothyroidism: Secondary | ICD-10-CM | POA: Diagnosis not present

## 2018-03-28 DIAGNOSIS — M8580 Other specified disorders of bone density and structure, unspecified site: Secondary | ICD-10-CM | POA: Diagnosis not present

## 2018-03-28 DIAGNOSIS — E7849 Other hyperlipidemia: Secondary | ICD-10-CM | POA: Diagnosis not present

## 2018-03-28 DIAGNOSIS — I10 Essential (primary) hypertension: Secondary | ICD-10-CM | POA: Diagnosis not present

## 2018-03-28 DIAGNOSIS — R82998 Other abnormal findings in urine: Secondary | ICD-10-CM | POA: Diagnosis not present

## 2018-03-30 DIAGNOSIS — E7849 Other hyperlipidemia: Secondary | ICD-10-CM | POA: Diagnosis not present

## 2018-03-30 DIAGNOSIS — Z23 Encounter for immunization: Secondary | ICD-10-CM | POA: Diagnosis not present

## 2018-03-30 DIAGNOSIS — E039 Hypothyroidism, unspecified: Secondary | ICD-10-CM | POA: Diagnosis not present

## 2018-03-30 DIAGNOSIS — E669 Obesity, unspecified: Secondary | ICD-10-CM | POA: Diagnosis not present

## 2018-03-30 DIAGNOSIS — I10 Essential (primary) hypertension: Secondary | ICD-10-CM | POA: Diagnosis not present

## 2018-09-21 DIAGNOSIS — I10 Essential (primary) hypertension: Secondary | ICD-10-CM | POA: Diagnosis not present

## 2018-09-21 DIAGNOSIS — E7849 Other hyperlipidemia: Secondary | ICD-10-CM | POA: Diagnosis not present

## 2018-09-21 DIAGNOSIS — E038 Other specified hypothyroidism: Secondary | ICD-10-CM | POA: Diagnosis not present

## 2018-09-26 DIAGNOSIS — E785 Hyperlipidemia, unspecified: Secondary | ICD-10-CM | POA: Diagnosis not present

## 2018-09-26 DIAGNOSIS — E039 Hypothyroidism, unspecified: Secondary | ICD-10-CM | POA: Diagnosis not present

## 2018-09-26 DIAGNOSIS — I10 Essential (primary) hypertension: Secondary | ICD-10-CM | POA: Diagnosis not present

## 2018-09-26 DIAGNOSIS — E669 Obesity, unspecified: Secondary | ICD-10-CM | POA: Diagnosis not present

## 2019-03-27 DIAGNOSIS — I10 Essential (primary) hypertension: Secondary | ICD-10-CM | POA: Diagnosis not present

## 2019-03-27 DIAGNOSIS — E7849 Other hyperlipidemia: Secondary | ICD-10-CM | POA: Diagnosis not present

## 2019-03-27 DIAGNOSIS — E038 Other specified hypothyroidism: Secondary | ICD-10-CM | POA: Diagnosis not present

## 2019-03-27 DIAGNOSIS — R82998 Other abnormal findings in urine: Secondary | ICD-10-CM | POA: Diagnosis not present

## 2019-03-28 DIAGNOSIS — E669 Obesity, unspecified: Secondary | ICD-10-CM | POA: Diagnosis not present

## 2019-03-28 DIAGNOSIS — I10 Essential (primary) hypertension: Secondary | ICD-10-CM | POA: Diagnosis not present

## 2019-03-28 DIAGNOSIS — E785 Hyperlipidemia, unspecified: Secondary | ICD-10-CM | POA: Diagnosis not present

## 2019-03-28 DIAGNOSIS — E039 Hypothyroidism, unspecified: Secondary | ICD-10-CM | POA: Diagnosis not present

## 2019-04-24 DIAGNOSIS — Z23 Encounter for immunization: Secondary | ICD-10-CM | POA: Diagnosis not present

## 2019-05-10 DIAGNOSIS — L814 Other melanin hyperpigmentation: Secondary | ICD-10-CM | POA: Diagnosis not present

## 2019-05-10 DIAGNOSIS — L918 Other hypertrophic disorders of the skin: Secondary | ICD-10-CM | POA: Diagnosis not present

## 2019-05-10 DIAGNOSIS — D2271 Melanocytic nevi of right lower limb, including hip: Secondary | ICD-10-CM | POA: Diagnosis not present

## 2019-05-10 DIAGNOSIS — L239 Allergic contact dermatitis, unspecified cause: Secondary | ICD-10-CM | POA: Diagnosis not present

## 2019-05-10 DIAGNOSIS — L821 Other seborrheic keratosis: Secondary | ICD-10-CM | POA: Diagnosis not present

## 2019-05-25 DIAGNOSIS — H40011 Open angle with borderline findings, low risk, right eye: Secondary | ICD-10-CM | POA: Diagnosis not present

## 2019-05-25 DIAGNOSIS — H2513 Age-related nuclear cataract, bilateral: Secondary | ICD-10-CM | POA: Diagnosis not present

## 2019-05-25 DIAGNOSIS — H5213 Myopia, bilateral: Secondary | ICD-10-CM | POA: Diagnosis not present

## 2019-07-13 ENCOUNTER — Ambulatory Visit: Payer: BC Managed Care – PPO | Attending: Internal Medicine

## 2019-07-13 DIAGNOSIS — Z23 Encounter for immunization: Secondary | ICD-10-CM

## 2019-07-13 NOTE — Progress Notes (Signed)
   Covid-19 Vaccination Clinic  Name:  Anne Simpson    MRN: [id number] DOB: 1955/09/19  07/13/2019  [name] was observed post Covid-19 immunization for 15 minutes without incident. She was provided with Vaccine Information Sheet and instruction to access the V-Safe system.   [name] was instructed to call 911 with any severe reactions post vaccine: Marland Kitchen Difficulty breathing  . Swelling of face and throat  . A fast heartbeat  . A bad rash all over body  . Dizziness and weakness   Immunizations Administered    Name Date Dose VIS Date Route   Pfizer COVID-19 Vaccine 07/13/2019 10:45 AM 0.3 mL 04/07/2019 Intramuscular   Manufacturer: [place]   Lot: [id number]   [place]: [id number]

## 2019-08-14 ENCOUNTER — Ambulatory Visit: Payer: BC Managed Care – PPO | Attending: Internal Medicine

## 2019-08-14 DIAGNOSIS — Z23 Encounter for immunization: Secondary | ICD-10-CM

## 2019-08-14 NOTE — Progress Notes (Signed)
   Covid-19 Vaccination Clinic  Name:  Anne Simpson    MRN: [id number] DOB: 09/20/1955  08/14/2019  Anne Simpson was observed post Covid-19 immunization for 15 minutes without incident. She was provided with Vaccine Information Sheet and instruction to access the V-Safe system.   Anne Simpson was instructed to call 911 with any severe reactions post vaccine: Marland Kitchen Difficulty breathing  . Swelling of face and throat  . A fast heartbeat  . A bad rash all over body  . Dizziness and weakness   Immunizations Administered    Name Date Dose VIS Date Route   Pfizer COVID-19 Vaccine 08/14/2019  8:19 AM 0.3 mL 06/21/2018 Intramuscular   Manufacturer: [place]   Lot: [id number]   [place]: [id number]

## 2019-09-06 ENCOUNTER — Encounter: Payer: Self-pay | Admitting: Gastroenterology

## 2019-09-21 DIAGNOSIS — E7849 Other hyperlipidemia: Secondary | ICD-10-CM | POA: Diagnosis not present

## 2019-09-21 DIAGNOSIS — E038 Other specified hypothyroidism: Secondary | ICD-10-CM | POA: Diagnosis not present

## 2019-09-22 DIAGNOSIS — I10 Essential (primary) hypertension: Secondary | ICD-10-CM | POA: Diagnosis not present

## 2019-09-22 DIAGNOSIS — R82998 Other abnormal findings in urine: Secondary | ICD-10-CM | POA: Diagnosis not present

## 2019-09-27 DIAGNOSIS — M25561 Pain in right knee: Secondary | ICD-10-CM | POA: Diagnosis not present

## 2019-09-27 DIAGNOSIS — I10 Essential (primary) hypertension: Secondary | ICD-10-CM | POA: Diagnosis not present

## 2019-09-27 DIAGNOSIS — E038 Other specified hypothyroidism: Secondary | ICD-10-CM | POA: Diagnosis not present

## 2019-09-27 DIAGNOSIS — E7849 Other hyperlipidemia: Secondary | ICD-10-CM | POA: Diagnosis not present

## 2019-10-03 ENCOUNTER — Encounter: Payer: Self-pay | Admitting: Gastroenterology

## 2019-12-26 DIAGNOSIS — J069 Acute upper respiratory infection, unspecified: Secondary | ICD-10-CM | POA: Diagnosis not present

## 2019-12-26 DIAGNOSIS — R05 Cough: Secondary | ICD-10-CM | POA: Diagnosis not present

## 2019-12-26 DIAGNOSIS — Z1152 Encounter for screening for COVID-19: Secondary | ICD-10-CM | POA: Diagnosis not present

## 2020-01-05 DIAGNOSIS — Z20828 Contact with and (suspected) exposure to other viral communicable diseases: Secondary | ICD-10-CM | POA: Diagnosis not present

## 2020-01-29 DIAGNOSIS — J320 Chronic maxillary sinusitis: Secondary | ICD-10-CM | POA: Diagnosis not present

## 2020-01-29 DIAGNOSIS — R439 Unspecified disturbances of smell and taste: Secondary | ICD-10-CM | POA: Diagnosis not present

## 2020-01-29 DIAGNOSIS — J328 Other chronic sinusitis: Secondary | ICD-10-CM | POA: Diagnosis not present

## 2020-01-29 DIAGNOSIS — J323 Chronic sphenoidal sinusitis: Secondary | ICD-10-CM | POA: Diagnosis not present

## 2020-01-29 DIAGNOSIS — J3489 Other specified disorders of nose and nasal sinuses: Secondary | ICD-10-CM | POA: Diagnosis not present

## 2020-01-29 DIAGNOSIS — J342 Deviated nasal septum: Secondary | ICD-10-CM | POA: Diagnosis not present

## 2020-03-15 DIAGNOSIS — I87392 Chronic venous hypertension (idiopathic) with other complications of left lower extremity: Secondary | ICD-10-CM | POA: Diagnosis not present

## 2020-03-15 DIAGNOSIS — M79605 Pain in left leg: Secondary | ICD-10-CM | POA: Diagnosis not present

## 2020-03-23 DIAGNOSIS — M545 Low back pain, unspecified: Secondary | ICD-10-CM | POA: Diagnosis not present

## 2020-03-23 DIAGNOSIS — M25562 Pain in left knee: Secondary | ICD-10-CM | POA: Diagnosis not present

## 2020-03-29 DIAGNOSIS — I10 Essential (primary) hypertension: Secondary | ICD-10-CM | POA: Diagnosis not present

## 2020-03-29 DIAGNOSIS — E785 Hyperlipidemia, unspecified: Secondary | ICD-10-CM | POA: Diagnosis not present

## 2020-03-29 DIAGNOSIS — E039 Hypothyroidism, unspecified: Secondary | ICD-10-CM | POA: Diagnosis not present

## 2020-03-29 DIAGNOSIS — M859 Disorder of bone density and structure, unspecified: Secondary | ICD-10-CM | POA: Diagnosis not present

## 2020-03-29 DIAGNOSIS — R82998 Other abnormal findings in urine: Secondary | ICD-10-CM | POA: Diagnosis not present

## 2020-04-02 DIAGNOSIS — I10 Essential (primary) hypertension: Secondary | ICD-10-CM | POA: Diagnosis not present

## 2020-04-02 DIAGNOSIS — E785 Hyperlipidemia, unspecified: Secondary | ICD-10-CM | POA: Diagnosis not present

## 2020-06-19 DIAGNOSIS — M25562 Pain in left knee: Secondary | ICD-10-CM | POA: Diagnosis not present

## 2020-06-20 ENCOUNTER — Other Ambulatory Visit: Payer: Self-pay | Admitting: Orthopaedic Surgery

## 2020-06-20 DIAGNOSIS — M25562 Pain in left knee: Secondary | ICD-10-CM

## 2020-06-25 DIAGNOSIS — J0100 Acute maxillary sinusitis, unspecified: Secondary | ICD-10-CM | POA: Diagnosis not present

## 2020-06-26 DIAGNOSIS — R6883 Chills (without fever): Secondary | ICD-10-CM | POA: Diagnosis not present

## 2020-07-11 ENCOUNTER — Other Ambulatory Visit: Payer: Self-pay

## 2020-07-11 ENCOUNTER — Ambulatory Visit
Admission: RE | Admit: 2020-07-11 | Discharge: 2020-07-11 | Disposition: A | Discharge status: Home / Self Care | Payer: BC Managed Care – PPO | Source: Ambulatory Visit | Attending: Orthopaedic Surgery | Admitting: Orthopaedic Surgery

## 2020-07-11 DIAGNOSIS — M25562 Pain in left knee: Secondary | ICD-10-CM

## 2020-07-11 IMAGING — MR MR KNEE*L* W/O CM
4 of 7 series · 22 of 40 positions shown · non-contrast
Comparison: None.

CLINICAL DATA: Left knee pain under the patella. Posterior knee
pain.

EXAM:
MRI OF THE LEFT KNEE WITHOUT CONTRAST
TECHNIQUE: Multiplanar, multisequence MR imaging of the knee was performed. No
intravenous contrast was administered.

[Series 4: T2 fat-sat · coronal · 4.0mm · 0.59mm/px · 6 of 24 slices shown (1 of 2)]
[im 1/24]
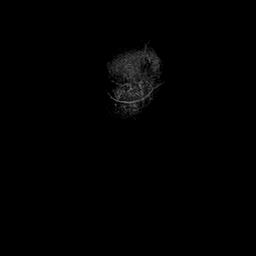
[im 5/24]
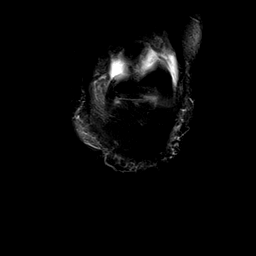
[im 10/24]
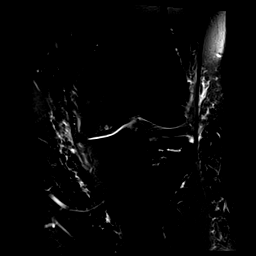
[im 14/24]
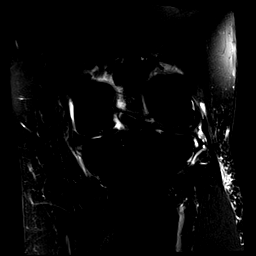
[im 19/24]
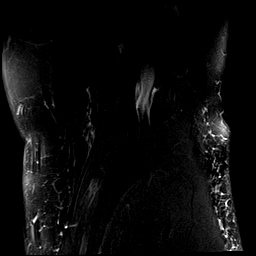
[im 24/24]
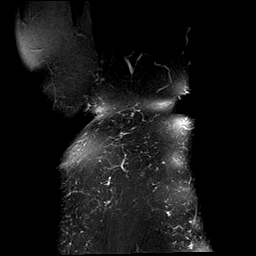

[Series 5: T1 · coronal · 4.0mm · 0.29mm/px · 4 of 24 slices shown]
[im 1/24]
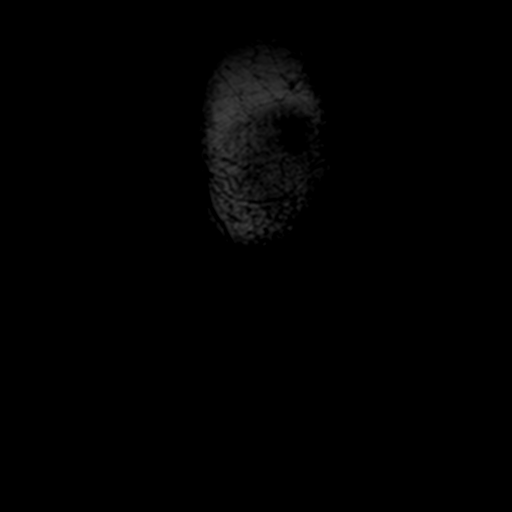
[im 5/24]
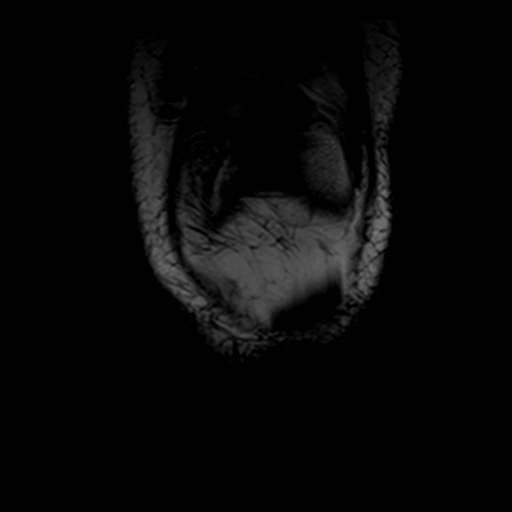
[im 14/24]
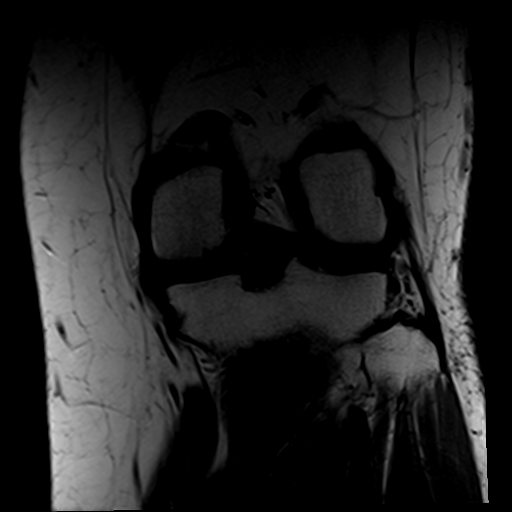
[im 24/24]
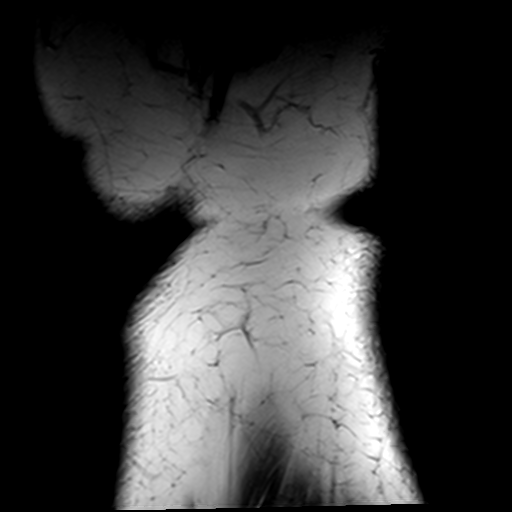

[Series 7: T2 fat-sat · sagittal · 3.0mm · 0.29mm/px · 6 of 28 slices shown (2 of 2)]
[im 1/28]
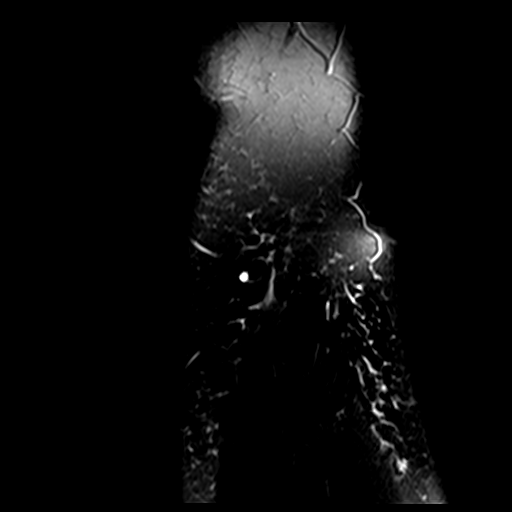
[im 6/28]
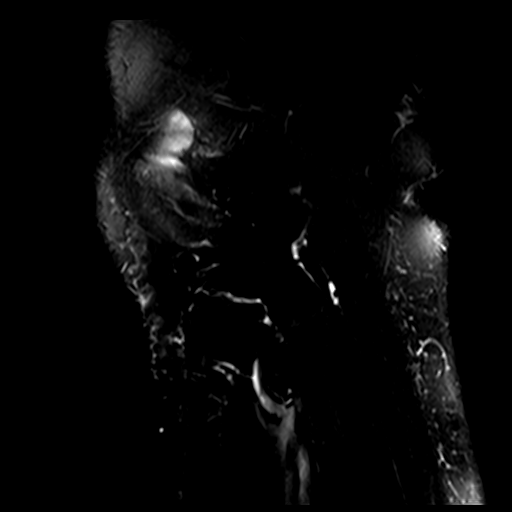
[im 11/28]
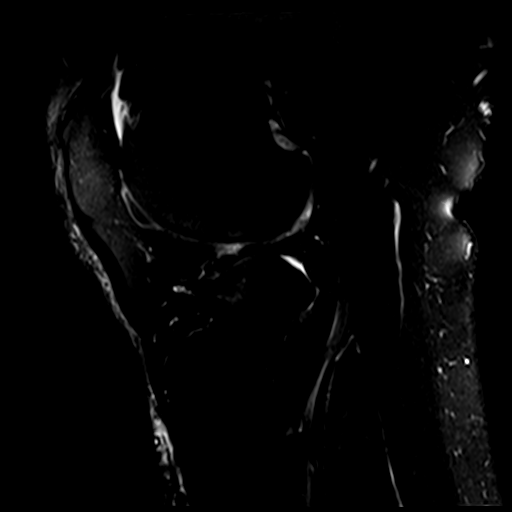
[im 17/28]
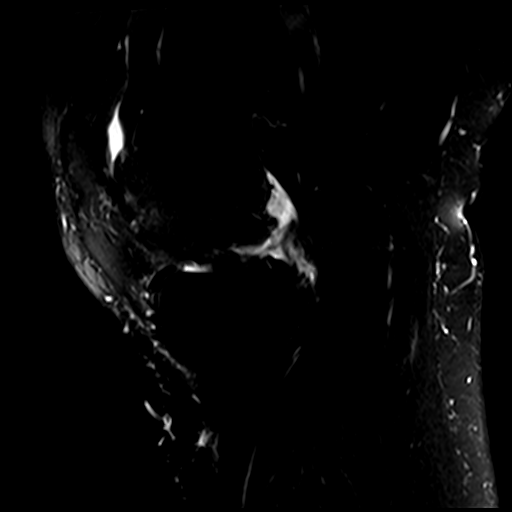
[im 22/28]
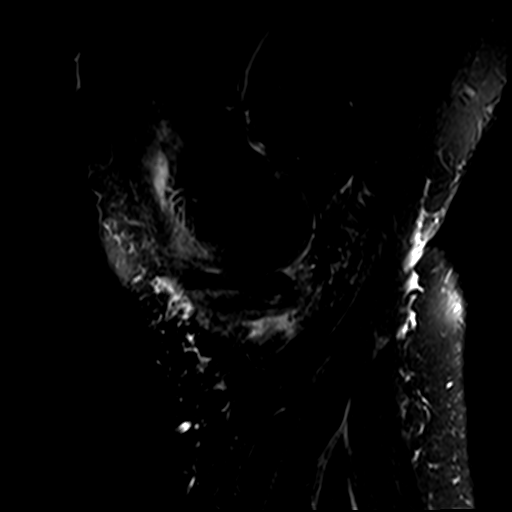
[im 28/28]
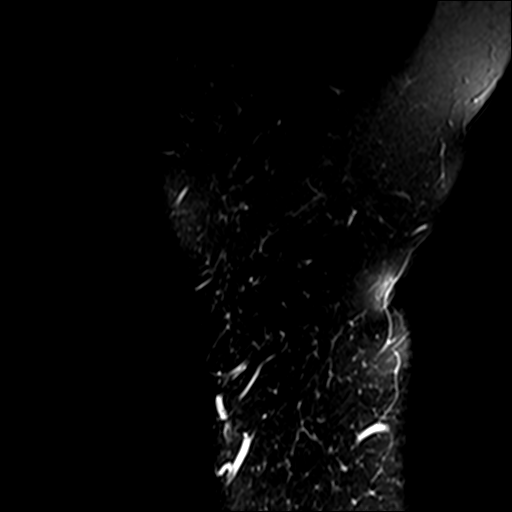

[Series 8: PD fat-sat · sagittal · 3.0mm · 0.29mm/px · 6 of 28 slices shown]
[im 1/28]
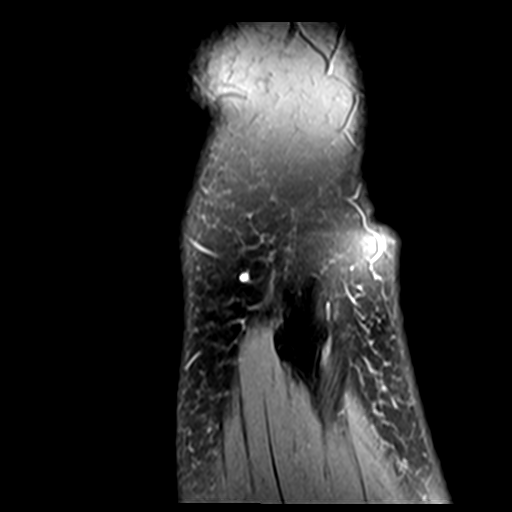
[im 6/28]
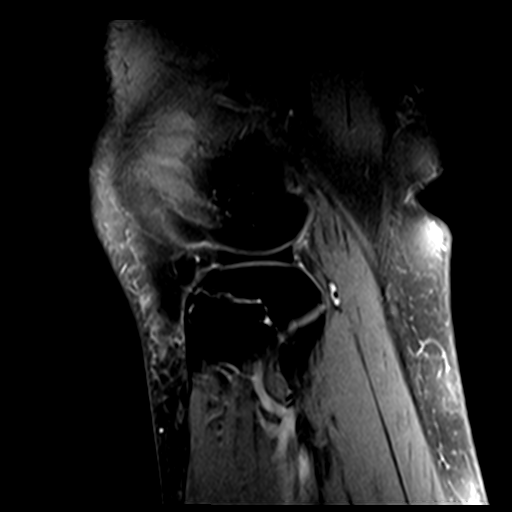
[im 11/28]
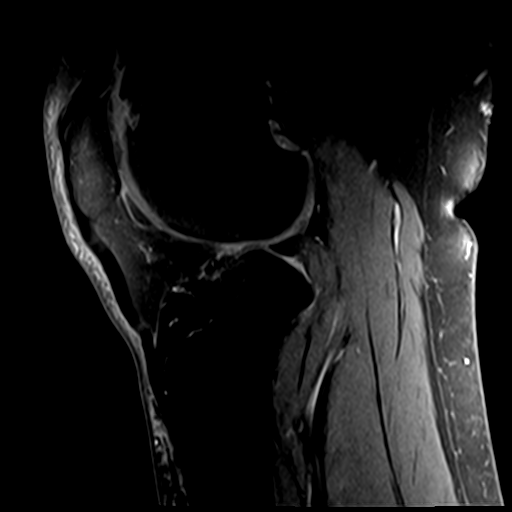
[im 17/28]
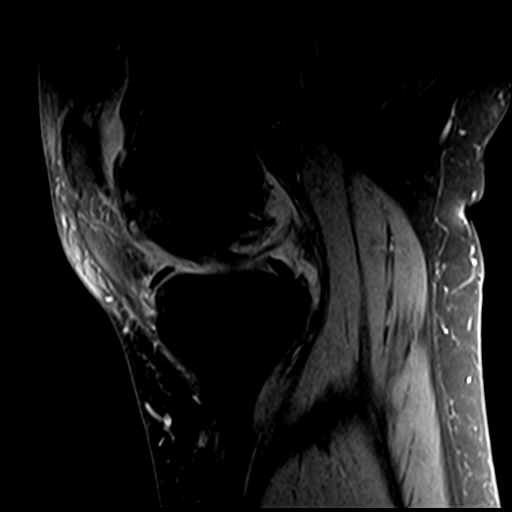
[im 22/28]
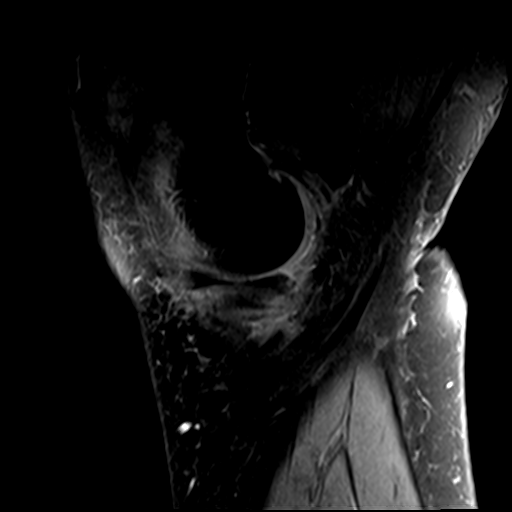
[im 28/28]
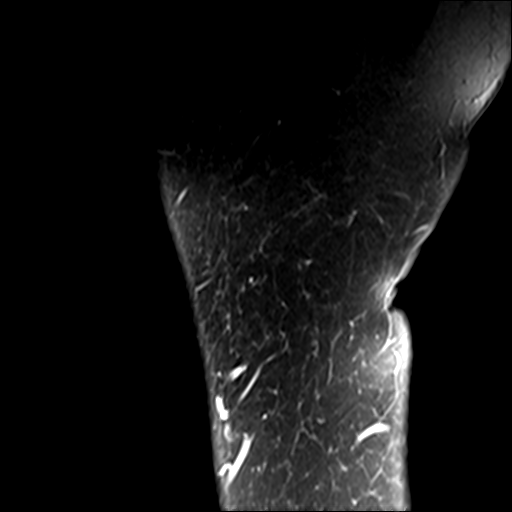

[22 of 40 positions shown; findings below may reference images not displayed]

FINDINGS: MENISCI

Medial: Severe complex tear of the posterior horn of the medial
meniscus with a radial component and peripheral meniscal extrusion.

Lateral: Intact.

LIGAMENTS

Cruciates: Intact ACL. ACL is mildly increased in signal and
expanded consistent with mucinous degeneration. Intact PCL.

Collaterals: Medial collateral ligament is intact. Lateral
collateral ligament complex is intact.

CARTILAGE

Patellofemoral: High-grade partial-thickness cartilage loss with
areas of full-thickness cartilage loss of the patella. High-grade
partial-thickness cartilage loss with areas of full-thickness
cartilage loss of the medial trochlea with subchondral reactive
marrow changes. Partial-thickness cartilage loss of the lateral
trochlea.

Medial: Extensive full-thickness cartilage loss of the medial
femorotibial compartment with subchondral reactive marrow changes.

Lateral: No focal chondral defect. Chondral thinning of the lateral
femoral condyle.

JOINT: Small joint effusion. Edema in superolateral Hoffa's fat. No
plical thickening.

POPLITEAL FOSSA: Popliteus tendon is intact. No Baker's cyst.

EXTENSOR MECHANISM: Intact quadriceps tendon. Intact patellar
tendon. Intact lateral patellar retinaculum. Intact medial patellar
retinaculum. Intact MPFL.

BONES: No aggressive osseous lesion. No fracture or dislocation.

Other: No fluid collection or hematoma. Muscles are normal.
IMPRESSION: 1. Severe complex tear of the posterior horn of the medial meniscus
with a radial component and peripheral meniscal extrusion.
2. High-grade partial-thickness cartilage loss with areas of
full-thickness cartilage loss of the patella. Partial-thickness
cartilage loss of the lateral trochlea.
3. Extensive full-thickness cartilage loss of the medial
femorotibial compartment with subchondral reactive marrow changes.
4. Edema in superolateral Hoffa's fat as can be seen with patellar
tendon-lateral femoral condyle friction syndrome.

## 2020-07-17 DIAGNOSIS — M25562 Pain in left knee: Secondary | ICD-10-CM | POA: Diagnosis not present

## 2020-07-23 DIAGNOSIS — Z6836 Body mass index (BMI) 36.0-36.9, adult: Secondary | ICD-10-CM | POA: Diagnosis not present

## 2020-07-23 DIAGNOSIS — D2262 Melanocytic nevi of left upper limb, including shoulder: Secondary | ICD-10-CM | POA: Diagnosis not present

## 2020-07-23 DIAGNOSIS — Z13820 Encounter for screening for osteoporosis: Secondary | ICD-10-CM | POA: Diagnosis not present

## 2020-07-23 DIAGNOSIS — Z01419 Encounter for gynecological examination (general) (routine) without abnormal findings: Secondary | ICD-10-CM | POA: Diagnosis not present

## 2020-07-23 DIAGNOSIS — D485 Neoplasm of uncertain behavior of skin: Secondary | ICD-10-CM | POA: Diagnosis not present

## 2020-07-23 DIAGNOSIS — L814 Other melanin hyperpigmentation: Secondary | ICD-10-CM | POA: Diagnosis not present

## 2020-07-23 DIAGNOSIS — D225 Melanocytic nevi of trunk: Secondary | ICD-10-CM | POA: Diagnosis not present

## 2020-07-23 DIAGNOSIS — D2261 Melanocytic nevi of right upper limb, including shoulder: Secondary | ICD-10-CM | POA: Diagnosis not present

## 2020-07-23 DIAGNOSIS — Z1231 Encounter for screening mammogram for malignant neoplasm of breast: Secondary | ICD-10-CM | POA: Diagnosis not present

## 2020-08-05 DIAGNOSIS — H903 Sensorineural hearing loss, bilateral: Secondary | ICD-10-CM | POA: Diagnosis not present

## 2020-08-05 DIAGNOSIS — H9312 Tinnitus, left ear: Secondary | ICD-10-CM | POA: Diagnosis not present

## 2020-08-22 DIAGNOSIS — Y999 Unspecified external cause status: Secondary | ICD-10-CM | POA: Diagnosis not present

## 2020-08-22 DIAGNOSIS — G8918 Other acute postprocedural pain: Secondary | ICD-10-CM | POA: Diagnosis not present

## 2020-08-22 DIAGNOSIS — X58XXXA Exposure to other specified factors, initial encounter: Secondary | ICD-10-CM | POA: Diagnosis not present

## 2020-08-22 DIAGNOSIS — S83232A Complex tear of medial meniscus, current injury, left knee, initial encounter: Secondary | ICD-10-CM | POA: Diagnosis not present

## 2020-08-22 DIAGNOSIS — M2242 Chondromalacia patellae, left knee: Secondary | ICD-10-CM | POA: Diagnosis not present

## 2020-09-06 DIAGNOSIS — M25662 Stiffness of left knee, not elsewhere classified: Secondary | ICD-10-CM | POA: Diagnosis not present

## 2020-09-06 DIAGNOSIS — M6281 Muscle weakness (generalized): Secondary | ICD-10-CM | POA: Diagnosis not present

## 2020-09-18 DIAGNOSIS — M6281 Muscle weakness (generalized): Secondary | ICD-10-CM | POA: Diagnosis not present

## 2020-09-18 DIAGNOSIS — M25662 Stiffness of left knee, not elsewhere classified: Secondary | ICD-10-CM | POA: Diagnosis not present

## 2020-09-20 DIAGNOSIS — M25662 Stiffness of left knee, not elsewhere classified: Secondary | ICD-10-CM | POA: Diagnosis not present

## 2020-09-20 DIAGNOSIS — M6281 Muscle weakness (generalized): Secondary | ICD-10-CM | POA: Diagnosis not present

## 2020-09-24 DIAGNOSIS — Z20822 Contact with and (suspected) exposure to covid-19: Secondary | ICD-10-CM | POA: Diagnosis not present

## 2020-10-02 DIAGNOSIS — M6281 Muscle weakness (generalized): Secondary | ICD-10-CM | POA: Diagnosis not present

## 2020-10-02 DIAGNOSIS — M25662 Stiffness of left knee, not elsewhere classified: Secondary | ICD-10-CM | POA: Diagnosis not present

## 2020-10-04 DIAGNOSIS — M6281 Muscle weakness (generalized): Secondary | ICD-10-CM | POA: Diagnosis not present

## 2020-10-04 DIAGNOSIS — M25662 Stiffness of left knee, not elsewhere classified: Secondary | ICD-10-CM | POA: Diagnosis not present

## 2020-10-09 ENCOUNTER — Other Ambulatory Visit: Payer: Self-pay | Admitting: Physician Assistant

## 2020-10-09 DIAGNOSIS — H903 Sensorineural hearing loss, bilateral: Secondary | ICD-10-CM

## 2020-11-07 ENCOUNTER — Other Ambulatory Visit: Payer: BC Managed Care – PPO

## 2020-11-26 ENCOUNTER — Other Ambulatory Visit: Payer: Self-pay

## 2020-11-26 ENCOUNTER — Ambulatory Visit
Admission: RE | Admit: 2020-11-26 | Discharge: 2020-11-26 | Disposition: A | Discharge status: Home / Self Care | Payer: BC Managed Care – PPO | Source: Ambulatory Visit | Attending: Physician Assistant | Admitting: Physician Assistant

## 2020-11-26 DIAGNOSIS — H903 Sensorineural hearing loss, bilateral: Secondary | ICD-10-CM | POA: Diagnosis not present

## 2020-11-26 IMAGING — MR MR BRAIN/IAC WO/W CM
12 of 13 series · 40 of 48 positions shown · IV contrast (multihance)
Comparison: None.

CLINICAL DATA: Asymmetric SNHL (sensorineural hearing loss) [TI]
([TI]-CM).

EXAM:
MRI HEAD WITHOUT AND WITH CONTRAST
TECHNIQUE: Multiplanar, multiecho pulse sequences of the brain and surrounding
structures were obtained without and with intravenous contrast.
CONTRAST:  15mL MULTIHANCE GADOBENATE DIMEGLUMINE 529 MG/ML IV SOLN

[Series 2: T1 · sagittal · 5.0mm · 0.45mm/px · 3 of 21 slices shown (1 of 4)]
[im 1/21]
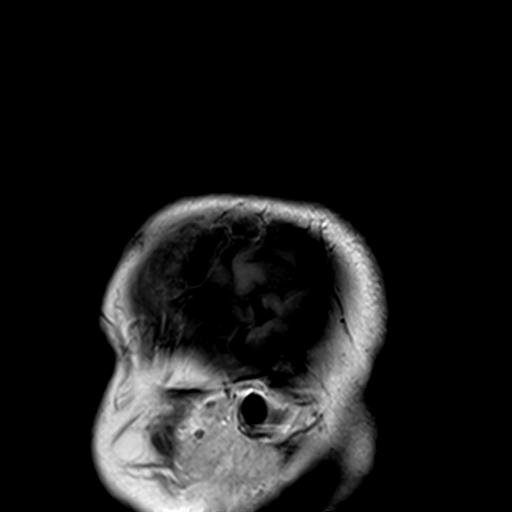
[im 11/21]
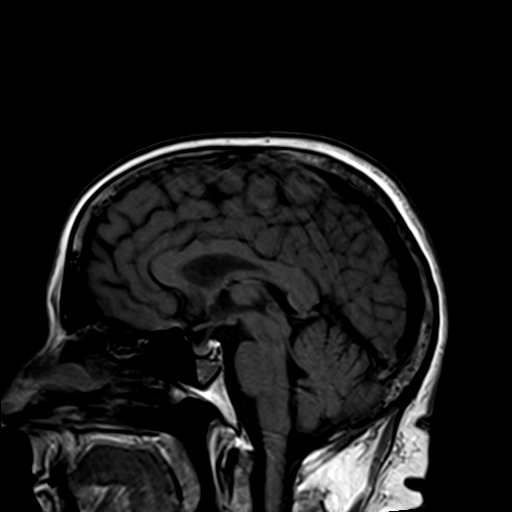
[im 21/21]
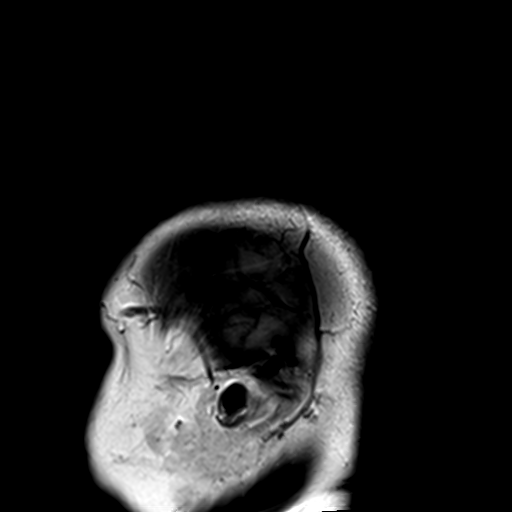

[Series 3: ep2d_diff_3 · axial · 3.0mm · 1.80mm/px · z∈[-67,+78]mm · 8 of 96 slices shown]
[im 1/96]
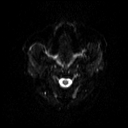
[im 12/96]
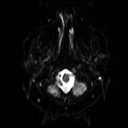
[im 24/96]
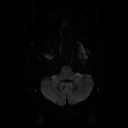
[im 36/96]
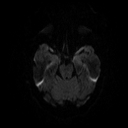
[im 60/96]
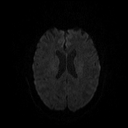
[im 72/96]
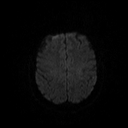
[im 84/96]
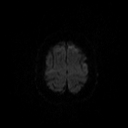
[im 96/96]
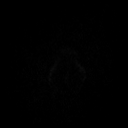

[Series 4: ep2d_diff_3_adc · axial · 3.0mm · 1.80mm/px · z∈[-67,+78]mm · 4 of 50 slices shown]
[im 1/50]
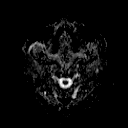
[im 17/50]
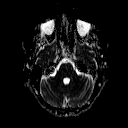
[im 33/50]
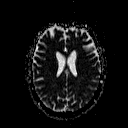
[im 50/50]
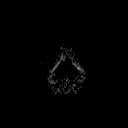

[Series 5: t2_tse_tra_512 · axial · 5.0mm · 0.60mm/px · z∈[-65,+78]mm · 2 of 24 slices shown]
[im 1/24]
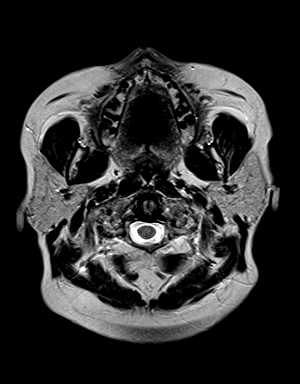
[im 24/24]
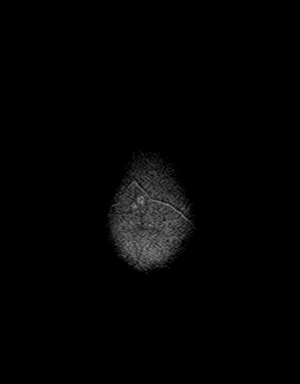

[Series 6: FLAIR · axial · 3.0mm · 0.43mm/px · z∈[-72,+82]mm · 2 of 27 slices shown]
[im 1/27]
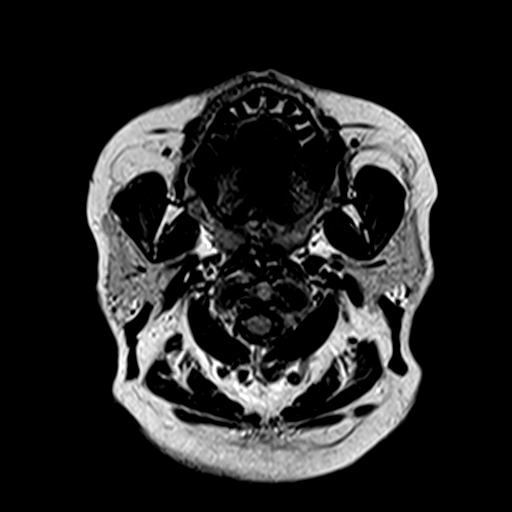
[im 27/27]
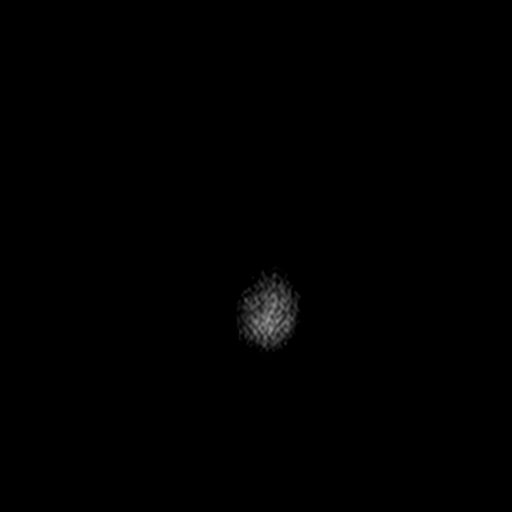

[Series 7: mip_images(sw) · axial · 16.0mm · 0.90mm/px · z∈[-64,+78]mm · 6 of 73 slices shown]
[im 1/73]
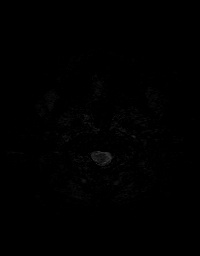
[im 15/73]
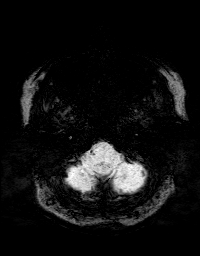
[im 29/73]
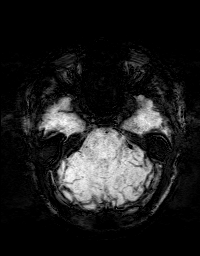
[im 44/73]
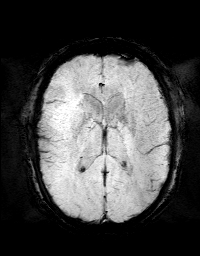
[im 58/73]
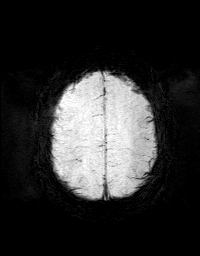
[im 73/73]
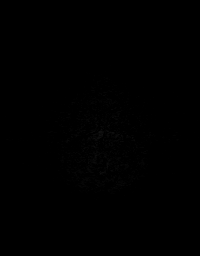

[Series 8: swi_images · axial · 2.0mm · 0.90mm/px · z∈[-71,+85]mm · 7 of 80 slices shown]
[im 1/80]
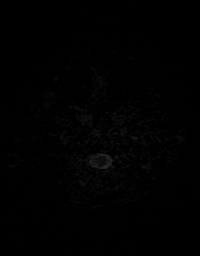
[im 14/80]
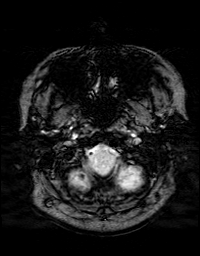
[im 27/80]
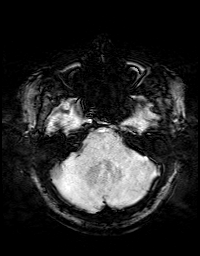
[im 40/80]
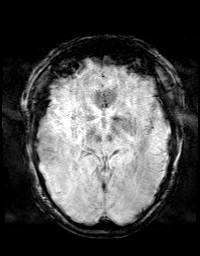
[im 53/80]
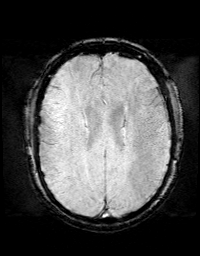
[im 66/80]
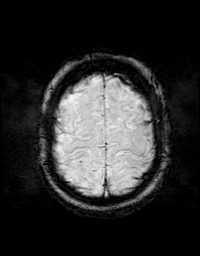
[im 80/80]
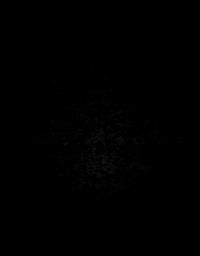

[Series 9: T1 · coronal · 3.0mm · 0.35mm/px · 1 of 11 slices shown (2 of 4)]
[im 1/11]
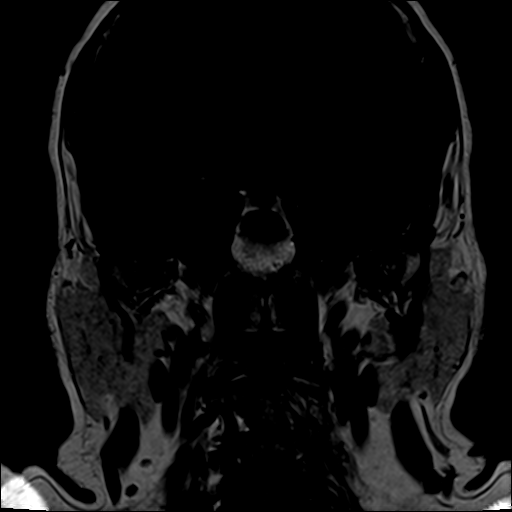

[Series 10: T1 · axial · 3.0mm · 0.35mm/px · 1 of 14 slices shown (3 of 4)]
[im 1/14]
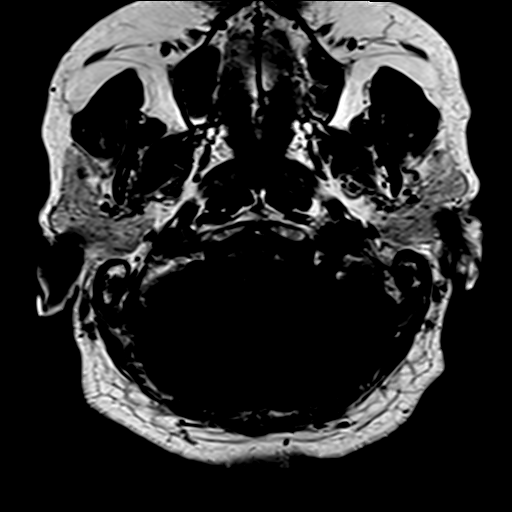

[Series 11: bSSFP · axial · 0.7mm · 0.28mm/px · z∈[-63,-31]mm · 4 of 48 slices shown]
[im 1/48]
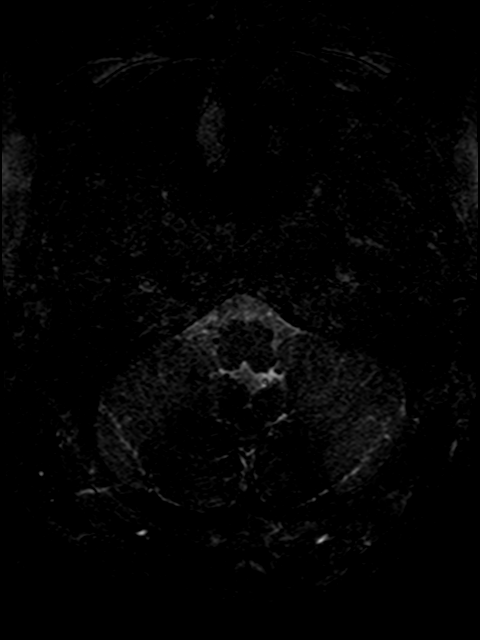
[im 16/48]
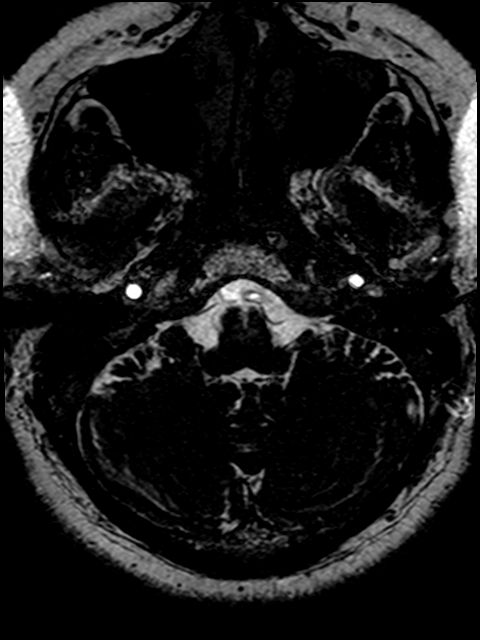
[im 32/48]
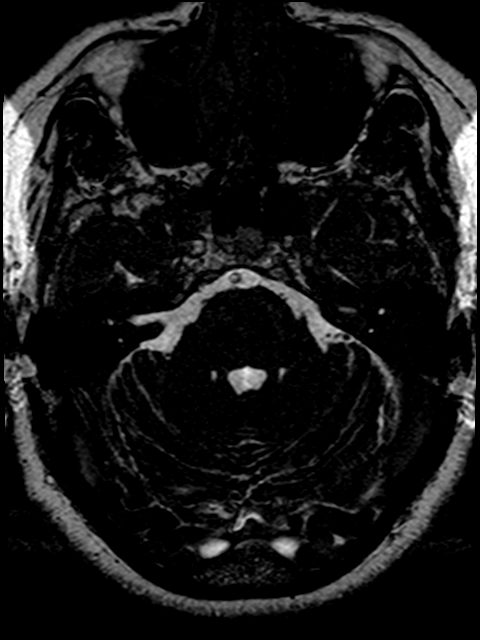
[im 48/48]
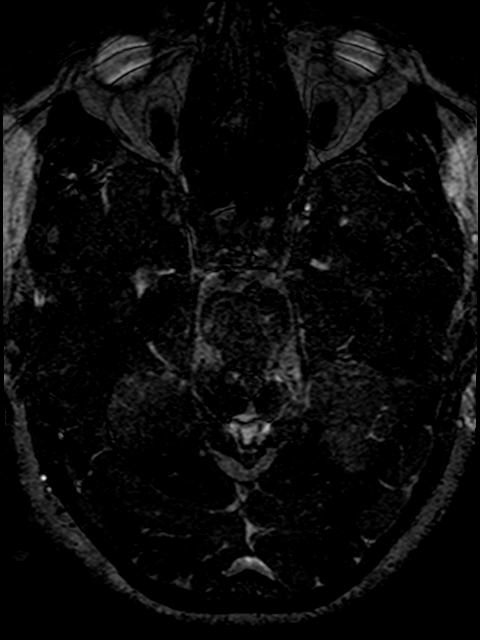

[Series 12: T1 · coronal · 3.0mm · 0.35mm/px · 1 of 11 slices shown (4 of 4)]
[im 1/11]
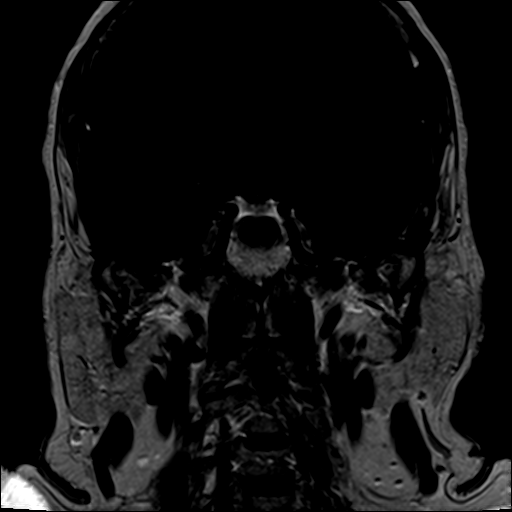

[Series 13: T1 post-contrast · axial · 3.0mm · 0.35mm/px · 1 of 14 slices shown]
[im 1/14]
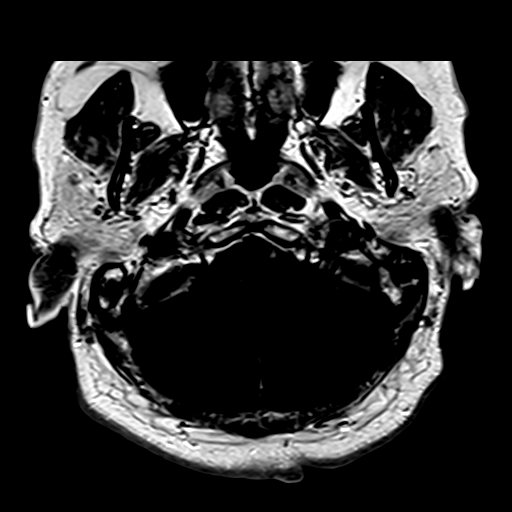

[40 of 48 positions shown; findings below may reference images not displayed]

FINDINGS: Brain: No acute infarction, hemorrhage, hydrocephalus, extra-axial
collection or mass lesion.

No cerebellopontine angle mass or internal auditory canal lesion is
demonstrated.Normal appearance of the 7th and 8th cranial nerves
bilaterally.

No focus of abnormal contrast enhancement.

Vascular: Normal flow voids.

Skull and upper cervical spine: Normal marrow signal.

Sinuses/Orbits: Fluid level within the bilateral sphenoid sinuses.

Other: None.
IMPRESSION: 1. Unremarkable MRI of the brain. Specifically, no cerebellopontine
angle or internal auditory canal mass or abnormal contrast
enhancement.
2. Fluid level within the bilateral sphenoid sinuses. Correlate
clinically for acute sinusitis.

## 2020-11-26 MED ORDER — GADOBENATE DIMEGLUMINE 529 MG/ML IV SOLN
15.0000 mL | Freq: Once | INTRAVENOUS | Status: AC | PRN
  Administered 2020-11-26: 15 mL via INTRAVENOUS

## 2020-12-12 DIAGNOSIS — L820 Inflamed seborrheic keratosis: Secondary | ICD-10-CM | POA: Diagnosis not present

## 2020-12-12 DIAGNOSIS — L910 Hypertrophic scar: Secondary | ICD-10-CM | POA: Diagnosis not present

## 2021-01-16 DIAGNOSIS — E785 Hyperlipidemia, unspecified: Secondary | ICD-10-CM | POA: Diagnosis not present

## 2021-02-24 DIAGNOSIS — I10 Essential (primary) hypertension: Secondary | ICD-10-CM | POA: Diagnosis not present

## 2021-04-25 DIAGNOSIS — I10 Essential (primary) hypertension: Secondary | ICD-10-CM | POA: Diagnosis not present

## 2021-05-22 DIAGNOSIS — J029 Acute pharyngitis, unspecified: Secondary | ICD-10-CM | POA: Diagnosis not present

## 2021-05-22 DIAGNOSIS — R058 Other specified cough: Secondary | ICD-10-CM | POA: Diagnosis not present

## 2021-05-22 DIAGNOSIS — J069 Acute upper respiratory infection, unspecified: Secondary | ICD-10-CM | POA: Diagnosis not present

## 2021-08-20 DIAGNOSIS — I10 Essential (primary) hypertension: Secondary | ICD-10-CM | POA: Diagnosis not present

## 2021-08-20 DIAGNOSIS — E039 Hypothyroidism, unspecified: Secondary | ICD-10-CM | POA: Diagnosis not present

## 2021-08-22 DIAGNOSIS — I10 Essential (primary) hypertension: Secondary | ICD-10-CM | POA: Diagnosis not present

## 2021-12-02 DIAGNOSIS — L918 Other hypertrophic disorders of the skin: Secondary | ICD-10-CM | POA: Diagnosis not present

## 2021-12-02 DIAGNOSIS — D1801 Hemangioma of skin and subcutaneous tissue: Secondary | ICD-10-CM | POA: Diagnosis not present

## 2021-12-02 DIAGNOSIS — D2262 Melanocytic nevi of left upper limb, including shoulder: Secondary | ICD-10-CM | POA: Diagnosis not present

## 2021-12-02 DIAGNOSIS — L814 Other melanin hyperpigmentation: Secondary | ICD-10-CM | POA: Diagnosis not present

## 2021-12-02 DIAGNOSIS — D225 Melanocytic nevi of trunk: Secondary | ICD-10-CM | POA: Diagnosis not present

## 2021-12-24 DIAGNOSIS — H401131 Primary open-angle glaucoma, bilateral, mild stage: Secondary | ICD-10-CM | POA: Diagnosis not present

## 2021-12-24 DIAGNOSIS — H5213 Myopia, bilateral: Secondary | ICD-10-CM | POA: Diagnosis not present

## 2021-12-24 DIAGNOSIS — H524 Presbyopia: Secondary | ICD-10-CM | POA: Diagnosis not present

## 2021-12-24 DIAGNOSIS — H25013 Cortical age-related cataract, bilateral: Secondary | ICD-10-CM | POA: Diagnosis not present

## 2021-12-24 DIAGNOSIS — H2513 Age-related nuclear cataract, bilateral: Secondary | ICD-10-CM | POA: Diagnosis not present

## 2022-03-03 ENCOUNTER — Other Ambulatory Visit: Payer: Self-pay | Admitting: Internal Medicine

## 2022-03-03 DIAGNOSIS — E785 Hyperlipidemia, unspecified: Secondary | ICD-10-CM

## 2022-10-07 ENCOUNTER — Other Ambulatory Visit: Payer: Self-pay | Admitting: Internal Medicine

## 2022-10-07 DIAGNOSIS — E785 Hyperlipidemia, unspecified: Secondary | ICD-10-CM

## 2022-10-30 ENCOUNTER — Encounter: Payer: Self-pay | Admitting: Gastroenterology

## 2022-11-20 ENCOUNTER — Ambulatory Visit
Admission: RE | Admit: 2022-11-20 | Discharge: 2022-11-20 | Disposition: A | Discharge status: Home / Self Care | Payer: No Typology Code available for payment source | Source: Ambulatory Visit | Attending: Internal Medicine | Admitting: Internal Medicine

## 2022-11-20 DIAGNOSIS — E785 Hyperlipidemia, unspecified: Secondary | ICD-10-CM

## 2023-03-12 ENCOUNTER — Other Ambulatory Visit (INDEPENDENT_AMBULATORY_CARE_PROVIDER_SITE_OTHER): Payer: Medicare HMO

## 2023-03-12 ENCOUNTER — Ambulatory Visit: Payer: Medicare HMO | Admitting: Gastroenterology

## 2023-03-12 ENCOUNTER — Encounter: Payer: Self-pay | Admitting: Gastroenterology

## 2023-03-12 VITALS — BP 136/80 | HR 100 | Ht 61.0 in | Wt 201.0 lb

## 2023-03-12 DIAGNOSIS — K120 Recurrent oral aphthae: Secondary | ICD-10-CM

## 2023-03-12 DIAGNOSIS — R1319 Other dysphagia: Secondary | ICD-10-CM | POA: Diagnosis not present

## 2023-03-12 DIAGNOSIS — Z860100 Personal history of colon polyps, unspecified: Secondary | ICD-10-CM

## 2023-03-12 DIAGNOSIS — K625 Hemorrhage of anus and rectum: Secondary | ICD-10-CM | POA: Diagnosis not present

## 2023-03-12 DIAGNOSIS — Z800 Family history of malignant neoplasm of digestive organs: Secondary | ICD-10-CM | POA: Diagnosis not present

## 2023-03-12 DIAGNOSIS — M549 Dorsalgia, unspecified: Secondary | ICD-10-CM

## 2023-03-12 DIAGNOSIS — K219 Gastro-esophageal reflux disease without esophagitis: Secondary | ICD-10-CM

## 2023-03-12 LAB — IBC + FERRITIN
Ferritin: 30.5 ng/mL (ref 10.0–291.0)
Iron: 36 ug/dL — ABNORMAL LOW (ref 42–145)
Saturation Ratios: 7.5 % — ABNORMAL LOW (ref 20.0–50.0)
TIBC: 483 ug/dL — ABNORMAL HIGH (ref 250.0–450.0)
Transferrin: 345 mg/dL (ref 212.0–360.0)

## 2023-03-12 LAB — C-REACTIVE PROTEIN: CRP: <1 mg/dL (ref 0.5–20.0)

## 2023-03-12 LAB — SEDIMENTATION RATE: Sed Rate: 48 mm/h — ABNORMAL HIGH (ref 0–30)

## 2023-03-12 LAB — VITAMIN D 25 HYDROXY (VIT D DEFICIENCY, FRACTURES): VITD: 56.8 ng/mL (ref 30.00–100.00)

## 2023-03-12 MED ORDER — HYDROCORTISONE (PERIANAL) 2.5 % EX CREA: 1.0000 | TOPICAL_CREAM | Freq: Two times a day (BID) | CUTANEOUS | 1 refills | Status: AC

## 2023-03-12 MED ORDER — NA SULFATE-K SULFATE-MG SULF 17.5-3.13-1.6 GM/177ML PO SOLN: 1.0000 | Freq: Once | ORAL | 0 refills | Status: AC

## 2023-03-12 NOTE — Patient Instructions (Addendum)
VISIT SUMMARY:  During today's visit, we discussed your recent symptoms, including rectal bleeding, burning sensation during bowel movements, acid reflux, back pain, and issues related to vitamin supplementation. We have outlined a plan to address each of these concerns and scheduled necessary follow-up procedures.  YOUR PLAN:  -RECTAL BLEEDING: Rectal bleeding can be caused by hemorrhoids, which are swollen veins in the lower rectum and anus. We will schedule a colonoscopy on 05/10/2022 to rule out other causes and have prescribed a prescription-strength hemorrhoid cream (Anusol) to help manage the symptoms.  -GASTROESOPHAGEAL REFLUX DISEASE (GERD): GERD is a condition where stomach acid frequently flows back into the tube connecting your mouth and stomach, causing irritation. Continue taking Prilosec as directed and consider taking Pepcid as needed. We have also scheduled an upper endoscopy on 05/10/2022 to further investigate your symptoms.  -MUSCULOSKELETAL PAIN: Musculoskeletal pain refers to pain in the muscles, bones, ligaments, tendons, and nerves. Your back pain is likely due to muscle strain. We recommend using a lidocaine patch or Voltaren gel for pain relief and advise you to avoid heavy lifting and straining.  -VITAMIN SUPPLEMENTATION: You are currently taking multiple vitamins, which may be causing mouth ulcers. We will check your B12, folate, and Vitamin D levels. If your levels are within the normal range, you should stop taking these vitamins.  INSTRUCTIONS:  Please follow up with the scheduled colonoscopy and upper endoscopy on 04/12/2023. Additionally, we have scheduled a follow-up appointment for you on 05/11/2023 to review your progress and any test results.  Due to recent changes in healthcare laws, you may see the results of your imaging and laboratory studies on MyChart before your provider has had a chance to review them.  We understand that in some cases there may be results  that are confusing or concerning to you. Not all laboratory results come back in the same time frame and the provider may be waiting for multiple results in order to interpret others.  Please give [place] 48 hours in order for your provider to thoroughly review all the results before contacting the office for clarification of your results.    I appreciate the  opportunity to care for you  Thank You   Marsa Aris , MD

## 2023-03-19 ENCOUNTER — Telehealth: Payer: Self-pay | Admitting: Gastroenterology

## 2023-03-19 NOTE — Telephone Encounter (Signed)
PT is calling to go over lab results. Please advise.

## 2023-03-19 NOTE — Telephone Encounter (Signed)
Spoke with the patient. She has seen her labs through My Chart. She is asking for the recommendations. Advised her once the doctor has reviewed her results, she will be contacted with recommendations. Patient bought Slo-Iron and some other vitamins.

## 2023-03-29 NOTE — Progress Notes (Unsigned)
Anne Simpson    [id number]    Nov 17, 1955  Primary Care Physician:Avva, Joylene Draft, MD  Referring Physician: Chilton Greathouse, MD [address] [place],  [place] [address]   Chief complaint: Hemorrhoids  Discussed the use of AI scribe software for clinical note transcription with the patient, who gave verbal consent to proceed.  History of Present Illness   The patient, with a history of acid reflux managed with Prilosec, presents with a chief complaint of rectal bleeding and burning sensation during bowel movements. The patient reports a two to three-month history of a burning sensation described as 'acid' during bowel movements, which occurs three to four times daily. This sensation is described as intense, likened to 'fire,' causing rawness and occasional streaks of blood on wiping due to the severity of the irritation. The patient denies any constipation and reports a history of episiotomy, which she believes contributes to her regular bowel movements.  The patient also reports a history of hemorrhoids, which she believes may be contributing to the rectal bleeding. She has recently completed a take-home colon test, which showed minuscule amounts of blood. The patient suspects this blood may be due to hemorrhoids rather than internal bleeding. The patient has a family history of colon cancer, with a brother who passed away from the disease at a young age.  The patient has been taking various vitamins, including vitamin C and zinc, and noticed a cessation of the burning episodes when she stopped taking these supplements. She also reports a sensitivity to acidic foods, which can cause mouth ulcers. The patient has been taking B12 and vitamin D supplements, which she dissolves under her tongue, and has noticed multiple ulcers under her tongue, which she attributes to these supplements.  The patient also reports a sensation of food 'staying' in her throat after eating, without  associated heartburn or indigestion. She also reports a feeling of fatigue and has requested a check of her B12 levels.  In addition to these symptoms, the patient reports a pain in her back, which she describes as different from a muscle pull. The pain is localized and does not radiate, and is exacerbated by certain movements, such as turning to wipe after a bowel movement. The patient denies any history of kidney stones but does report a history of lifting heavy objects regularly due to her work. She has been using Voltaren gel for knee pain due to arthritis and has considered using it for this back pain as well.          Outpatient Encounter Medications as of 03/12/2023  Medication Sig   dextromethorphan-guaiFENesin (MUCINEX DM) 30-600 MG 12hr tablet Take 1 tablet by mouth 2 (two) times daily.   famotidine (PEPCID AC) 10 MG chewable tablet Chew 10 mg by mouth as needed for heartburn.   hydrocortisone (ANUSOL-HC) 2.5 % rectal cream Place 1 Application rectally 2 (two) times daily. For 7 days   levothyroxine (SYNTHROID, LEVOTHROID) 50 MCG tablet One tablet by mouth once daily   losartan (COZAAR) 100 MG tablet !/2 tablet by mouth twice daily   [EXPIRED] Na Sulfate-K Sulfate-Mg Sulf 17.5-3.13-1.6 GM/177ML SOLN Take 1 kit by mouth once for 1 dose.   omeprazole (PRILOSEC) 20 MG capsule Take 20 mg by mouth daily.   dicyclomine (BENTYL) 10 MG capsule Take 1 capsule (10 mg total) by mouth 2 (two) times daily as needed for spasms. (Patient not taking: Reported on 10/17/2014)   estradiol (ESTRACE) 0.1  MG/GM vaginal cream Place vaginally as directed. (Patient not taking: Reported on 03/12/2023)   fluticasone (FLONASE) 50 MCG/ACT nasal spray Place 2 sprays into both nostrils daily. (Patient not taking: Reported on 03/12/2023)   Lidocaine, Anorectal, (RECTICARE) 5 % CREA Use rectally three times a day as needed (Patient not taking: Reported on 03/12/2023)   Omega-3 Fatty Acids (FISH OIL PO) Take by mouth as  directed. (Patient not taking: Reported on 03/12/2023)   ondansetron (ZOFRAN) 8 MG tablet Take 1 tablet (8 mg total) by mouth 2 (two) times daily. AS NEEDED (Patient not taking: Reported on 10/17/2014)   No facility-administered encounter medications on file as of 03/12/2023.    Allergies as of 03/12/2023 - Review Complete 03/12/2023  Allergen Reaction Noted   Clindamycin/lincomycin Itching 11/21/2013    Past Medical History:  Diagnosis Date   Colon polyp 06/09/2001   HYPERPLASTIC POLYP   Esophageal reflux    Essential hypertension, benign    Fatty liver 11/02/2013   [place]   Hypothyroidism    Osteopenia    Pain in thoracic spine    Polyp of gallbladder 11/02/2013   [place]    Reflux esophagitis     Past Surgical History:  Procedure Laterality Date   COLONOSCOPY     ESOPHAGOGASTRODUODENOSCOPY  05/04/2000    Family History  Problem Relation Age of Onset   Colon cancer Brother 63       Deceased    Colon polyps Brother    Colon polyps Mother    Irritable bowel syndrome Mother    Diabetes Brother    Stomach cancer Neg Hx    Esophageal cancer Neg Hx     Social History   Socioeconomic History   Marital status: Married    Spouse name: Not on file   Number of children: 1   Years of education: Not on file   Highest education level: Not on file  Occupational History   Occupation: Futures trader   Occupation: home maker  Tobacco Use   Smoking status: Never   Smokeless tobacco: Never  Vaping Use   Vaping status: Never Used  Substance and Sexual Activity   Alcohol use: No   Drug use: No   Sexual activity: Not on file  Other Topics Concern   Not on file  Social History Narrative   One large Mcdonald sweet tea daily    Social Determinants of Health   Financial Resource Strain: Not on file  Food Insecurity: Not on file  Transportation Needs: Not on file  Physical Activity: Not on file  Stress: Not on file  Social Connections: Unknown (02/11/2023)   Received from [facility]   Social Network    Social Network: Not on file  Intimate Partner Violence: Unknown (02/11/2023)   Received from [facility]   HITS    Physically Hurt: Not on file    Insult or Talk Down To: Not on file    Threaten Physical Harm: Not on file    Scream or Curse: Not on file      Review of systems: All other review of systems negative except as mentioned in the HPI.   Physical Exam: Vitals:   03/12/23 1352  BP: 136/80  Pulse: 100   Body mass index is 37.98 kg/m. Gen:      No acute distress HEENT:  sclera anicteric Abd:      soft, non-tender; no palpable masses, no distension Ext:    No edema Neuro: alert and oriented x 3  Psych: normal mood and affect  Data Reviewed:  Reviewed labs, radiology imaging, old records and pertinent past GI work up  Results           Assessment and Plan/Recommendations:  Assessment and Plan    Rectal bleeding Reports of burning sensation and blood streaks on wiping for the past 2-3 months. Likely due to hemorrhoids and irritation from excess bile. Family history of colon cancer. -Schedule colonoscopy on 04/12/2023. -Prescribe prescription strength hemorrhoid cream (Anusol).  Gastroesophageal Reflux Disease (GERD) Reports of acid reflux symptoms, particularly when not eating regularly. Currently on Prilosec. -Continue Prilosec as directed. -Consider taking Pepcid as needed. -Schedule upper endoscopy on 04/12/2023.  Musculoskeletal pain Reports of localized pain in the back, likely due to muscle strain. -Recommend use of lidocaine patch or Voltaren gel for pain relief. -Advise to avoid heavy lifting and straining.  Vitamin supplementation Currently taking multiple individual vitamins including B12, Vitamin D, and Vitamin C. Reports of mouth ulcers possibly related to citrusy vitamin supplements. -Check B12, folate, and Vitamin D levels. -Advise to stop taking vitamins if levels are within normal range.  Follow-up  appointment scheduled for 05/11/2023.          This visit required *** minutes of patient care (this includes precharting, chart review, review of results, face-to-face time used for counseling as well as treatment plan and follow-up. The patient was provided an opportunity to ask questions and all were answered. The patient agreed with the plan and demonstrated an understanding of the instructions.  Iona Beard , MD    CC: Chilton Greathouse, MD

## 2023-03-30 ENCOUNTER — Encounter: Payer: Self-pay | Admitting: Gastroenterology

## 2023-05-11 ENCOUNTER — Encounter: Payer: Self-pay | Admitting: Gastroenterology

## 2023-05-11 ENCOUNTER — Ambulatory Visit: Payer: Medicare HMO | Admitting: Gastroenterology

## 2023-05-11 VITALS — BP 100/79 | HR 90 | Temp 98.2°F | Resp 15 | Ht 61.0 in | Wt 201.0 lb

## 2023-05-11 DIAGNOSIS — Z860100 Personal history of colon polyps, unspecified: Secondary | ICD-10-CM

## 2023-05-11 DIAGNOSIS — K5730 Diverticulosis of large intestine without perforation or abscess without bleeding: Secondary | ICD-10-CM | POA: Diagnosis not present

## 2023-05-11 DIAGNOSIS — K209 Esophagitis, unspecified without bleeding: Secondary | ICD-10-CM | POA: Diagnosis not present

## 2023-05-11 DIAGNOSIS — Z1211 Encounter for screening for malignant neoplasm of colon: Secondary | ICD-10-CM | POA: Diagnosis not present

## 2023-05-11 DIAGNOSIS — Z800 Family history of malignant neoplasm of digestive organs: Secondary | ICD-10-CM

## 2023-05-11 DIAGNOSIS — K449 Diaphragmatic hernia without obstruction or gangrene: Secondary | ICD-10-CM | POA: Diagnosis not present

## 2023-05-11 DIAGNOSIS — K219 Gastro-esophageal reflux disease without esophagitis: Secondary | ICD-10-CM

## 2023-05-11 DIAGNOSIS — R1319 Other dysphagia: Secondary | ICD-10-CM

## 2023-05-11 DIAGNOSIS — K648 Other hemorrhoids: Secondary | ICD-10-CM

## 2023-05-11 DIAGNOSIS — D122 Benign neoplasm of ascending colon: Secondary | ICD-10-CM

## 2023-05-11 DIAGNOSIS — K644 Residual hemorrhoidal skin tags: Secondary | ICD-10-CM | POA: Diagnosis not present

## 2023-05-11 MED ORDER — SODIUM CHLORIDE 0.9 % IV SOLN: 500.0000 mL | Freq: Once | INTRAVENOUS | Status: DC

## 2023-05-11 NOTE — Op Note (Addendum)
 [facility] Patient Name: Anne Simpson Procedure Date: 05/11/2023 1:17 PM MRN: [id number] Endoscopist: Gustav ALONSO Mcgee , MD, [id number] Age: 68 Referring MD:  Date of Birth: [birth year] Gender: Female Account #: [id number] Procedure:                Colonoscopy Indications:              Screening in patient at increased risk: Family                            history of 1st-degree relative with colorectal                            cancer before age 49 years Medicines:                Monitored Anesthesia Care Procedure:                Pre-Anesthesia Assessment:                           - Prior to the procedure, a History and Physical                            was performed, and patient medications and                            allergies were reviewed. The patient's tolerance of                            previous anesthesia was also reviewed. The risks                            and benefits of the procedure and the sedation                            options and risks were discussed with the patient.                            All questions were answered, and informed consent                            was obtained. Prior Anticoagulants: The patient has                            taken no anticoagulant or antiplatelet agents. ASA                            Grade Assessment: II - A patient with mild systemic                            disease. After reviewing the risks and benefits,                            the patient was deemed in satisfactory condition to  undergo the procedure.                           After obtaining informed consent, the colonoscope                            was passed under direct vision. Throughout the                            procedure, the patient's blood pressure, pulse, and                            oxygen saturations were monitored continuously. The                            Olympus Scope [id number] was  introduced through the                            anus and advanced to the the cecum, identified by                            appendiceal orifice and ileocecal valve. The                            colonoscopy was performed without difficulty. The                            patient tolerated the procedure well. The quality                            of the bowel preparation was good. The ileocecal                            valve, appendiceal orifice, and rectum were                            photographed. Scope In: 1:48:45 PM Scope Out: 2:03:55 PM Scope Withdrawal Time: 0 hours 12 minutes 3 seconds  Total Procedure Duration: 0 hours 15 minutes 10 seconds  Findings:                 The perianal and digital rectal examinations were                            normal.                           Two sessile polyps were found in the ascending                            colon. The polyps were 3 to 4 mm in size. These                            polyps were removed with a cold snare. Resection  and retrieval were complete.                           A few small-mouthed diverticula were found in the                            sigmoid colon.                           Non-bleeding external and internal hemorrhoids were                            found during retroflexion. The hemorrhoids were                            medium-sized. Complications:            No immediate complications. Estimated Blood Loss:     Estimated blood loss was minimal. Impression:               - Two 3 to 4 mm polyps in the ascending colon,                            removed with a cold snare. Resected and retrieved.                           - Diverticulosis in the sigmoid colon.                           - Non-bleeding external and internal hemorrhoids. Recommendation:           - Patient has a contact number available for                            emergencies. The signs and symptoms of  potential                            delayed complications were discussed with the                            patient. Return to normal activities tomorrow.                            Written discharge instructions were provided to the                            patient.                           - Resume previous diet.                           - Continue present medications.                           - Await pathology results.                           -  Repeat colonoscopy in 5 years for surveillance                            based on pathology results.                           - Use Recticare HC small pea size amount per rectum                            three times daily as needed Amya Hlad V. Jaiyla Granados, MD 05/11/2023 2:14:43 PM This report has been signed electronically.

## 2023-05-11 NOTE — Progress Notes (Signed)
 Vss nad trans to pacu

## 2023-05-11 NOTE — Progress Notes (Signed)
 Pt provided with samples of RectiCare per MD request.

## 2023-05-11 NOTE — Op Note (Addendum)
 [facility] Patient Name: Anne Simpson Procedure Date: 05/11/2023 1:17 PM MRN: [id number] Endoscopist: Gustav ALONSO Mcgee , MD, [id number] Age: 68 Referring MD:  Date of Birth: 02-21-1956 Gender: Female Account #: [id number] Procedure:                Upper GI endoscopy Indications:              Esophageal reflux symptoms that persist despite                            appropriate therapy Medicines:                Monitored Anesthesia Care Procedure:                Pre-Anesthesia Assessment:                           - Prior to the procedure, a History and Physical                            was performed, and patient medications and                            allergies were reviewed. The patient's tolerance of                            previous anesthesia was also reviewed. The risks                            and benefits of the procedure and the sedation                            options and risks were discussed with the patient.                            All questions were answered, and informed consent                            was obtained. Prior Anticoagulants: The patient has                            taken no anticoagulant or antiplatelet agents. ASA                            Grade Assessment: II - A patient with mild systemic                            disease. After reviewing the risks and benefits,                            the patient was deemed in satisfactory condition to                            undergo the procedure.  After obtaining informed consent, the endoscope was                            passed under direct vision. Throughout the                            procedure, the patient's blood pressure, pulse, and                            oxygen saturations were monitored continuously. The                            GIF [id number] #[id number] was introduced through the                            mouth, and advanced to the second  part of duodenum.                            The upper GI endoscopy was somewhat difficult due                            to the patient's oxygen desaturation due to body                            habitus. Successful completion of the procedure was                            aided by performing chin lift, administering oxygen                            and treating with ambu bag. The patient tolerated                            the procedure well. Scope In: Scope Out: Findings:                 The esophagus and gastroesophageal junction were                            examined with white light and narrow band imaging                            (NBI) from a forward view and retroflexed position.                            There were esophageal mucosal changes suspicious                            for short-segment Barrett's esophagus. Small                            mucosal nodule at EG junction. These changes  involved the mucosa at the upper extent of the                            gastric folds (40 cm from the incisors) extending                            to the Z-line (38 cm from the incisors). Two                            tongues of salmon-colored mucosa were present from                            36 to 38 cm. The maximum longitudinal extent of                            these esophageal mucosal changes was 2 cm in                            length. Mucosa was biopsied with a cold forceps for                            histology. One specimen bottle was sent to                            pathology.                           A small hiatal hernia was present.                           The stomach was normal.                           The cardia and gastric fundus were normal on                            retroflexion.                           The examined duodenum was normal. Complications:            No immediate complications. Estimated Blood Loss:      Estimated blood loss was minimal. Impression:               - Esophageal mucosal changes suspicious for                            short-segment Barrett's esophagus. Biopsied.                           - Small hiatal hernia.                           - Normal stomach.                           -  Normal examined duodenum. Recommendation:           - Patient has a contact number available for                            emergencies. The signs and symptoms of potential                            delayed complications were discussed with the                            patient. Return to normal activities tomorrow.                            Written discharge instructions were provided to the                            patient.                           - Resume previous diet.                           - Continue present medications.                           - Await pathology results.                           - Follow an antireflux regimen.                           - Repeat upper endoscopy based on pathology results.                           - F/u with ENT/PMD for sleep study to exclude sleep                            apnea Gustav ALONSO Mcgee, MD 05/11/2023 2:26:50 PM This report has been signed electronically.

## 2023-05-11 NOTE — Progress Notes (Signed)
 [facility] History and Physical   Primary Care Physician:  Avva, Ravisankar, MD   Reason for Procedure:  Family h/o colon cancer, h/o colon polyps and GERD  Plan:    EGD and colonoscopy with possible interventions as needed     HPI: Anne Simpson is a very pleasant 68 y.o. female here for EGD and colonoscopy for GERD, family h/o colon cancer and personal h/o colon polyps. Please refer to office visit note 03/12/23 for additional details  The risks and benefits as well as alternatives of endoscopic procedure(s) have been discussed and reviewed. All questions answered. The patient agrees to proceed.    Past Medical History:  Diagnosis Date   Colon polyp 06/09/2001   HYPERPLASTIC POLYP   Esophageal reflux    Essential hypertension, benign    Fatty liver 11/02/2013   [facility]    Hypothyroidism    Osteopenia    Pain in thoracic spine    Polyp of gallbladder 11/02/2013   [facility]     Reflux esophagitis     Past Surgical History:  Procedure Laterality Date   COLONOSCOPY     ESOPHAGOGASTRODUODENOSCOPY  05/04/2000   PARTIAL HYSTERECTOMY     left ovary remains    Prior to Admission medications   Medication Sig Start Date End Date Taking? Authorizing Provider  amLODipine (NORVASC) 2.5 MG tablet SMARTSIG:1 Tablet(s) By Mouth Every Evening 03/29/23  Yes [provider]  cholecalciferol (VITAMIN D3) 25 MCG (1000 UNIT) tablet Take 4,000 Units by mouth. 08/05/20  Yes [provider]  dextromethorphan-guaiFENesin (MUCINEX DM) 30-600 MG 12hr tablet Take 1 tablet by mouth 2 (two) times daily.   Yes [provider]  famotidine (PEPCID AC) 10 MG chewable tablet Chew 10 mg by mouth as needed for heartburn.   Yes [provider]  levothyroxine (SYNTHROID, LEVOTHROID) 50 MCG tablet 75 mcg. One tablet by mouth once daily 11/20/13  Yes [provider]  losartan (COZAAR) 100 MG tablet !/2 tablet by mouth twice daily 11/20/13  Yes [provider]  Lysine 1000 MG TABS Take by mouth.   Yes [provider]  omeprazole (PRILOSEC) 40 MG capsule Take 40 mg by mouth daily.   Yes [provider]  pantoprazole (PROTONIX) 40 MG tablet Take 40 mg by mouth daily. 04/02/23  Yes [provider]  Zinc Gluconate 50 MG CAPS Take by mouth. 08/05/20  Yes [provider]  cetirizine (ZYRTEC) 10 MG tablet Take by mouth. 10/18/17   [provider]  hydrocortisone  (ANUSOL -HC) 2.5 % rectal cream Place 1 Application rectally 2 (two) times daily. For 7 days 03/12/23   Tiffanni Scarfo V, MD    Current Outpatient Medications  Medication Sig Dispense Refill   amLODipine (NORVASC) 2.5 MG tablet SMARTSIG:1 Tablet(s) By Mouth Every Evening     cholecalciferol (VITAMIN D3) 25 MCG (1000 UNIT) tablet Take 4,000 Units by mouth.     dextromethorphan-guaiFENesin (MUCINEX DM) 30-600 MG 12hr tablet Take 1 tablet by mouth 2 (two) times daily.     famotidine (PEPCID AC) 10 MG chewable tablet Chew 10 mg by mouth as needed for heartburn.     levothyroxine (SYNTHROID, LEVOTHROID) 50 MCG tablet 75 mcg. One tablet by mouth once daily     losartan (COZAAR) 100 MG tablet !/2 tablet by mouth twice daily     Lysine 1000 MG TABS Take by mouth.     omeprazole (PRILOSEC) 40 MG capsule Take 40 mg by mouth daily.     pantoprazole (PROTONIX) 40  MG tablet Take 40 mg by mouth daily.     Zinc Gluconate 50 MG CAPS Take by mouth.     cetirizine (ZYRTEC) 10 MG tablet Take by mouth.     hydrocortisone  (ANUSOL -HC) 2.5 % rectal cream Place 1 Application rectally 2 (two) times daily. For 7 days 30 g 1   Current Facility-Administered Medications  Medication Dose Route Frequency Provider Last Rate Last Admin   0.9 %  sodium chloride  infusion  500 mL Intravenous Once Carsten Carstarphen V, MD        Allergies as of 05/11/2023 - Review Complete 05/11/2023  Allergen Reaction Noted   Clindamycin/lincomycin Itching 11/21/2013   Lincomycin hcl Itching  11/21/2013   Amoxicillin-pot clavulanate Rash 07/30/2015   Azithromycin Rash 07/30/2015    Family History  Problem Relation Age of Onset   Colon polyps Mother    Irritable bowel syndrome Mother    Colon cancer Brother 6       Deceased    Colon polyps Brother    Diabetes Brother    Stomach cancer Neg Hx    Esophageal cancer Neg Hx    Rectal cancer Neg Hx     Social History   Socioeconomic History   Marital status: Married    Spouse name: Not on file   Number of children: 1   Years of education: Not on file   Highest education level: Not on file  Occupational History   Occupation: Futures Trader   Occupation: home maker  Tobacco Use   Smoking status: Never   Smokeless tobacco: Never  Vaping Use   Vaping status: Never Used  Substance and Sexual Activity   Alcohol use: No   Drug use: No   Sexual activity: Not on file  Other Topics Concern   Not on file  Social History Narrative   One large Mcdonald sweet tea daily    Social Drivers of Health   Financial Resource Strain: Not on file  Food Insecurity: Not on file  Transportation Needs: Not on file  Physical Activity: Not on file  Stress: Not on file  Social Connections: Unknown (02/11/2023)   [facility]   Social Network    Social Network: Not on file  Intimate Partner Violence: Unknown (02/11/2023)   Received from [facility]   HITS    Physically Hurt: Not on file    Insult or Talk Down To: Not on file    Threaten Physical Harm: Not on file    Scream or Curse: Not on file    Review of Systems:  All other review of systems negative except as mentioned in the HPI.  Physical Exam: Vital signs in last 24 hours: BP (!) 169/84   Pulse 97   Temp 98.2 F (36.8 C) (Temporal)   Ht 5' 1 (1.549 m)   Wt 201 lb (91.2 kg)   SpO2 96%   BMI 37.98 kg/m  General:   Alert, NAD Lungs:  Clear .   Heart:  Regular rate and rhythm Abdomen:  Soft, nontender and nondistended. Neuro/Psych:  Alert and  cooperative. Normal mood and affect. A and O x 3  Reviewed labs, radiology imaging, old records and pertinent past GI work up  Patient is appropriate for planned procedure(s) and anesthesia in an ambulatory setting   K. Veena Dannika Hilgeman , MD [phone]

## 2023-05-11 NOTE — Progress Notes (Signed)
 Called to room to assist during endoscopic procedure.  Patient ID and intended procedure confirmed with present staff. Received instructions for my participation in the procedure from the performing physician.

## 2023-05-11 NOTE — Patient Instructions (Addendum)
 Handouts provided on hiatal hernia, polyps, diverticulosis and hemorrhoids.  Resume previous diet.  Continue present medications.  Await pathology results.  Follow an antireflux regimen (see hiatal hernia handout).  Repeat colonoscopy in 5 years for surveillance based on pathology results.  Repeat upper endoscopy based on pathology results.   YOU HAD AN ENDOSCOPIC PROCEDURE TODAY AT THE [facility]:   Refer to the procedure report that was given to you for any specific questions about what was found during the examination.  If the procedure report does not answer your questions, please call your gastroenterologist to clarify.  If you requested that your care partner not be given the details of your procedure findings, then the procedure report has been included in a sealed envelope for you to review at your convenience later.  YOU SHOULD EXPECT: Some feelings of bloating in the abdomen. Passage of more gas than usual.  Walking can help get rid of the air that was put into your GI tract during the procedure and reduce the bloating. If you had a lower endoscopy (such as a colonoscopy or flexible sigmoidoscopy) you may notice spotting of blood in your stool or on the toilet paper. If you underwent a bowel prep for your procedure, you may not have a normal bowel movement for a few days.  Please Note:  You might notice some irritation and congestion in your nose or some drainage.  This is from the oxygen used during your procedure.  There is no need for concern and it should clear up in a day or so.  SYMPTOMS TO REPORT IMMEDIATELY:  Following lower endoscopy (colonoscopy or flexible sigmoidoscopy):  Excessive amounts of blood in the stool  Significant tenderness or worsening of abdominal pains  Swelling of the abdomen that is new, acute  Fever of 100F or higher  Following upper endoscopy (EGD)  Vomiting of blood or coffee ground material  New chest pain or pain under the shoulder  blades  Painful or persistently difficult swallowing  New shortness of breath  Fever of 100F or higher  Black, tarry-looking stools  For urgent or emergent issues, a gastroenterologist can be reached at any hour by calling [phone]. Do not use MyChart messaging for urgent concerns.    DIET:  We do recommend a small meal at first, but then you may proceed to your regular diet.  Drink plenty of fluids but you should avoid alcoholic beverages for 24 hours.  ACTIVITY:  You should plan to take it easy for the rest of today and you should NOT DRIVE or use heavy machinery until tomorrow (because of the sedation medicines used during the test).    FOLLOW UP: Our staff will call the number listed on your records the next business day following your procedure.  We will call around 7:15- 8:00 am to check on you and address any questions or concerns that you may have regarding the information given to you following your procedure. If we do not reach you, we will leave a message.     If any biopsies were taken you will be contacted by phone or by letter within the next 1-3 weeks.  Please call us  at [phone] if you have not heard about the biopsies in 3 weeks.    SIGNATURES/CONFIDENTIALITY: You and/or your care partner have signed paperwork which will be entered into your electronic medical record.  These signatures attest to the fact that that the information above on your After Visit Summary has  been reviewed and is understood.  Full responsibility of the confidentiality of this discharge information lies with you and/or your care-partner.

## 2023-05-12 ENCOUNTER — Telehealth: Payer: Self-pay | Admitting: *Deleted

## 2023-05-12 NOTE — Telephone Encounter (Signed)
  Follow up Call-     05/11/2023    1:09 PM  Call back number  Post procedure Call Back phone  # [phone]  Permission to leave phone message Yes     Patient questions:  Do you have a fever, pain , or abdominal swelling? No. Pain Score  0 *  Have you tolerated food without any problems? Yes.    Have you been able to return to your normal activities? Yes.    Do you have any questions about your discharge instructions: Diet   No. Medications  No. Follow up visit  No.  Do you have questions or concerns about your Care? No.  Actions: * If pain score is 4 or above: No action needed, pain <4.

## 2023-05-14 LAB — SURGICAL PATHOLOGY

## 2023-06-02 ENCOUNTER — Encounter: Payer: Self-pay | Admitting: Gastroenterology

## 2023-09-27 DIAGNOSIS — E039 Hypothyroidism, unspecified: Secondary | ICD-10-CM | POA: Diagnosis not present

## 2023-09-27 DIAGNOSIS — I10 Essential (primary) hypertension: Secondary | ICD-10-CM | POA: Diagnosis not present

## 2023-09-27 DIAGNOSIS — E785 Hyperlipidemia, unspecified: Secondary | ICD-10-CM | POA: Diagnosis not present

## 2023-09-27 DIAGNOSIS — M8589 Other specified disorders of bone density and structure, multiple sites: Secondary | ICD-10-CM | POA: Diagnosis not present

## 2023-09-27 DIAGNOSIS — D509 Iron deficiency anemia, unspecified: Secondary | ICD-10-CM | POA: Diagnosis not present

## 2023-10-04 DIAGNOSIS — E039 Hypothyroidism, unspecified: Secondary | ICD-10-CM | POA: Diagnosis not present

## 2023-10-04 DIAGNOSIS — Z0000 Encounter for general adult medical examination without abnormal findings: Secondary | ICD-10-CM | POA: Diagnosis not present

## 2023-10-04 DIAGNOSIS — D126 Benign neoplasm of colon, unspecified: Secondary | ICD-10-CM | POA: Diagnosis not present

## 2023-10-04 DIAGNOSIS — E669 Obesity, unspecified: Secondary | ICD-10-CM | POA: Diagnosis not present

## 2023-10-04 DIAGNOSIS — I10 Essential (primary) hypertension: Secondary | ICD-10-CM | POA: Diagnosis not present

## 2023-10-04 DIAGNOSIS — R7301 Impaired fasting glucose: Secondary | ICD-10-CM | POA: Diagnosis not present

## 2023-10-04 DIAGNOSIS — K121 Other forms of stomatitis: Secondary | ICD-10-CM | POA: Diagnosis not present

## 2023-10-04 DIAGNOSIS — M25561 Pain in right knee: Secondary | ICD-10-CM | POA: Diagnosis not present

## 2023-10-04 DIAGNOSIS — R82998 Other abnormal findings in urine: Secondary | ICD-10-CM | POA: Diagnosis not present

## 2023-11-16 DIAGNOSIS — H40023 Open angle with borderline findings, high risk, bilateral: Secondary | ICD-10-CM | POA: Diagnosis not present

## 2024-02-28 DIAGNOSIS — D2262 Melanocytic nevi of left upper limb, including shoulder: Secondary | ICD-10-CM | POA: Diagnosis not present

## 2024-02-28 DIAGNOSIS — D485 Neoplasm of uncertain behavior of skin: Secondary | ICD-10-CM | POA: Diagnosis not present

## 2024-02-28 DIAGNOSIS — L821 Other seborrheic keratosis: Secondary | ICD-10-CM | POA: Diagnosis not present

## 2024-02-28 DIAGNOSIS — D225 Melanocytic nevi of trunk: Secondary | ICD-10-CM | POA: Diagnosis not present

## 2024-02-28 DIAGNOSIS — D2239 Melanocytic nevi of other parts of face: Secondary | ICD-10-CM | POA: Diagnosis not present

## 2024-02-28 DIAGNOSIS — D2261 Melanocytic nevi of right upper limb, including shoulder: Secondary | ICD-10-CM | POA: Diagnosis not present

## 2024-02-28 DIAGNOSIS — L918 Other hypertrophic disorders of the skin: Secondary | ICD-10-CM | POA: Diagnosis not present

## 2024-02-28 DIAGNOSIS — L814 Other melanin hyperpigmentation: Secondary | ICD-10-CM | POA: Diagnosis not present

## 2024-03-20 DIAGNOSIS — Z83511 Family history of glaucoma: Secondary | ICD-10-CM | POA: Diagnosis not present

## 2024-03-20 DIAGNOSIS — H40053 Ocular hypertension, bilateral: Secondary | ICD-10-CM | POA: Diagnosis not present

## 2024-03-20 DIAGNOSIS — H40023 Open angle with borderline findings, high risk, bilateral: Secondary | ICD-10-CM | POA: Diagnosis not present
